# Patient Record
Sex: Female | Born: 1980 | Race: Black or African American | Hispanic: No | Marital: Single | Smoking: Never smoker
Health system: Southern US, Community
[De-identification: ages and names within clinical notes are randomized; demographics above are authoritative.]

## PROBLEM LIST (undated history)

## (undated) DIAGNOSIS — A6000 Herpesviral infection of urogenital system, unspecified: Secondary | ICD-10-CM

## (undated) HISTORY — DX: Herpesviral infection of urogenital system, unspecified: A60.00

## (undated) HISTORY — PX: NO PAST SURGERIES: SHX2092

---

## 2002-03-26 ENCOUNTER — Emergency Department (HOSPITAL_COMMUNITY): Admission: EM | Admit: 2002-03-26 | Discharge: 2002-03-26 | Payer: Self-pay | Admitting: Emergency Medicine

## 2020-07-27 DIAGNOSIS — N390 Urinary tract infection, site not specified: Secondary | ICD-10-CM | POA: Diagnosis not present

## 2020-07-27 DIAGNOSIS — R399 Unspecified symptoms and signs involving the genitourinary system: Secondary | ICD-10-CM | POA: Diagnosis not present

## 2020-07-29 NOTE — Patient Instructions (Signed)
I value your feedback and entrusting us with your care. If you get a [facility] patient survey, I would appreciate you taking the time to let us know about your experience today. Thank you!  As of October 04, 2019, your lab results will be released to your MyChart immediately, before I even have a chance to see them. Please give me time to review them and contact you if there are any abnormalities. Thank you for your patience.  

## 2020-07-29 NOTE — Progress Notes (Signed)
Patient, No Pcp Per   Chief Complaint  Patient presents with   Urinary Tract Infection    pressure, frequency, burning after urinating x 1 week    HPI:      [name] is a 39 y.o. No obstetric history on file. whose LMP was Patient's last menstrual period was 07/10/2020 (exact date)., presents today for NP eval of UTI sx for the past wk. Has urinary frequency and urgency, with and without good flow, as well as pelvic discomfort/pressure at end of stream. No hematuria, LBP, fevers. Not drinking caffeine, also not drinking much water. No hx of UTIs/kidney stones. Had blood and leuks on UA at urgent care with neg C&S 2 days ago. Was given macrobid before C&S results but pt hasn't taken yet since C&S neg. No vag odor/increased d/c. Maybe mild irritation. Using new dryer sheets, uses vagisil soap. She is not sex active.  New to area. Last annual 12/20, she thinks pap due this yr. No hx of abn paps.  Menses monthly, last 5 days, rare BTB, mild dysmen. On OCPS.   History reviewed. No pertinent past medical history.  History reviewed. No pertinent surgical history.  Family History  Problem Relation Age of Onset   Hypertension Mother    Heart disease Father    Heart attack Maternal Grandfather    Lung cancer Paternal Grandmother    Prostate cancer Paternal Grandmother     Social History   Socioeconomic History   Marital status: Single    Spouse name: Not on file   Number of children: Not on file   Years of education: Not on file   Highest education level: Not on file  Occupational History   Not on file  Tobacco Use   Smoking status: Never Smoker   Smokeless tobacco: Never Used  Vaping Use   Vaping Use: Never used  Substance and Sexual Activity   Alcohol use: Not Currently   Drug use: Never   Sexual activity: Not Currently    Birth control/protection: Pill  Other Topics Concern   Not on file  Social History Narrative   Not on file    Social Determinants of Health   Financial Resource Strain:    Difficulty of Paying Living Expenses: Not on file  Food Insecurity:    Worried About Running Out of Food in the Last Year: Not on file   [employer] of Food in the Last Year: Not on file  Transportation Needs:    Lack of Transportation (Medical): Not on file   Lack of Transportation (Non-Medical): Not on file  Physical Activity:    Days of Exercise per Week: Not on file   Minutes of Exercise per Session: Not on file  Stress:    Feeling of Stress : Not on file  Social Connections:    Frequency of Communication with Friends and Family: Not on file   Frequency of Social Gatherings with Friends and Family: Not on file   Attends Religious Services: Not on file   Active Member of Clubs or Organizations: Not on file   Attends Banker Meetings: Not on file   Marital Status: Not on file  Intimate Partner Violence:    Fear of Current or Ex-Partner: Not on file   Emotionally Abused: Not on file   Physically Abused: Not on file   Sexually Abused: Not on file    Outpatient Medications Prior to Visit  Medication Sig Dispense Refill   levonorgestrel-ethinyl  estradiol (ALTAVERA) 0.15-30 MG-MCG tablet      valACYclovir HCl (VALTREX PO) Take by mouth as needed.      fluconazole (DIFLUCAN) 150 MG tablet Take 150 mg by mouth once. (Patient not taking: Reported on 07/30/2020)     nitrofurantoin, macrocrystal-monohydrate, (MACROBID) 100 MG capsule Take by mouth. (Patient not taking: Reported on 07/30/2020)     No facility-administered medications prior to visit.      ROS:  Review of Systems  Constitutional: Negative for fever.  Gastrointestinal: Negative for blood in stool, constipation, diarrhea, nausea and vomiting.  Genitourinary: Positive for frequency, pelvic pain and urgency. Negative for dyspareunia, dysuria, flank pain, hematuria, vaginal bleeding, vaginal discharge and vaginal pain.   Musculoskeletal: Negative for back pain.  Skin: Negative for rash.   BREAST: No symptoms   OBJECTIVE:   Vitals:  BP 130/80    Ht 5\' 4"  (1.626 m)    Wt 172 lb (78 kg)    LMP 07/10/2020 (Exact Date)    BMI 29.52 kg/m   Physical Exam Vitals reviewed.  Constitutional:      Appearance: She is well-developed.  Pulmonary:     Effort: Pulmonary effort is normal.  Genitourinary:    General: Normal vulva.     Pubic Area: No rash.      Labia:        Right: No rash, tenderness or lesion.        Left: No rash, tenderness or lesion.      Vagina: Normal. No vaginal discharge, erythema or tenderness.     Cervix: Normal.     Uterus: Normal. Not enlarged and not tender.      Adnexa: Right adnexa normal and left adnexa normal.       Right: No mass or tenderness.         Left: No mass or tenderness.    Musculoskeletal:        General: Normal range of motion.     Cervical back: Normal range of motion.  Skin:    General: Skin is warm and dry.  Neurological:     General: No focal deficit present.     Mental Status: She is alert and oriented to person, place, and time.  Psychiatric:        Mood and Affect: Mood normal.        Behavior: Behavior normal.        Thought Content: Thought content normal.        Judgment: Judgment normal.     Results: Results for orders placed or performed in visit on 07/30/20 (from the past 24 hour(s))  POCT Urinalysis Dipstick     Status: Normal   Collection Time: 07/30/20 10:45 AM  Result Value Ref Range   Color, UA yellow    Clarity, UA clear    Glucose, UA Negative Negative   Bilirubin, UA neg    Ketones, UA neg    Spec Grav, UA 1.020 1.010 - 1.025   Blood, UA neg    pH, UA 5.0 5.0 - 8.0   Protein, UA Negative Negative   Urobilinogen, UA     Nitrite, UA neg    Leukocytes, UA Negative Negative   Appearance     Odor       Assessment/Plan: Urinary urgency - Plan: POCT Urinalysis Dipstick; Neg UA, neg C&S 2 days ago. Question bladder spasm.  Try uribel, increase water intake. If sx persist, can do macrobid Rx (already picked up Rx) to see if sx  improve. Rule out STDs.  Bladder spasm - Plan: Meth-Hyo-M Bl-Na Phos-Ph Sal (URIBEL) 118 MG CAPS  Cervical cancer screening - Plan: Cytology - PAP  Screening for STD (sexually transmitted disease) - Plan: Cytology - PAP  Screening for HPV (human papillomavirus) - Plan: Cytology - PAP  Vaginal irritation--neg exam. Line dry underwear, dove sens skin soap. F/u prn.   Meds ordered this encounter  Medications   Meth-Hyo-M Bl-Na Phos-Ph Sal (URIBEL) 118 MG CAPS    Sig: Take 1 capsule (118 mg total) by mouth 4 (four) times daily as needed.    Dispense:  30 capsule    Refill:  0    Order Specific Question:   Supervising Provider    Answer:   Nadara Mustard [[id number]]      Return if symptoms worsen or fail to improve.  Dhanvi Boesen B. Avyon Herendeen, PA-C 07/30/2020 10:48 AM

## 2020-07-30 ENCOUNTER — Ambulatory Visit (INDEPENDENT_AMBULATORY_CARE_PROVIDER_SITE_OTHER): Payer: BC Managed Care – PPO | Admitting: Obstetrics and Gynecology

## 2020-07-30 ENCOUNTER — Other Ambulatory Visit (HOSPITAL_COMMUNITY)
Admission: RE | Admit: 2020-07-30 | Discharge: 2020-07-30 | Disposition: A | Discharge status: Home / Self Care | Payer: BC Managed Care – PPO | Source: Ambulatory Visit | Attending: Obstetrics and Gynecology | Admitting: Obstetrics and Gynecology

## 2020-07-30 ENCOUNTER — Other Ambulatory Visit: Payer: Self-pay

## 2020-07-30 ENCOUNTER — Encounter: Payer: Self-pay | Admitting: Obstetrics and Gynecology

## 2020-07-30 VITALS — BP 130/80 | Ht 64.0 in | Wt 172.0 lb

## 2020-07-30 DIAGNOSIS — Z124 Encounter for screening for malignant neoplasm of cervix: Secondary | ICD-10-CM

## 2020-07-30 DIAGNOSIS — Z1151 Encounter for screening for human papillomavirus (HPV): Secondary | ICD-10-CM | POA: Diagnosis not present

## 2020-07-30 DIAGNOSIS — Z113 Encounter for screening for infections with a predominantly sexual mode of transmission: Secondary | ICD-10-CM | POA: Insufficient documentation

## 2020-07-30 DIAGNOSIS — N3289 Other specified disorders of bladder: Secondary | ICD-10-CM

## 2020-07-30 DIAGNOSIS — R3915 Urgency of urination: Secondary | ICD-10-CM

## 2020-07-30 LAB — POCT URINALYSIS DIPSTICK
Bilirubin, UA: NEGATIVE
Blood, UA: NEGATIVE
Glucose, UA: NEGATIVE
Ketones, UA: NEGATIVE
Leukocytes, UA: NEGATIVE
Nitrite, UA: NEGATIVE
Protein, UA: NEGATIVE
Spec Grav, UA: 1.02 (ref 1.010–1.025)
pH, UA: 5 (ref 5.0–8.0)

## 2020-07-30 MED ORDER — URIBEL 118 MG PO CAPS: 118.0000 mg | ORAL_CAPSULE | Freq: Four times a day (QID) | ORAL | 0 refills | Status: DC | PRN

## 2020-07-31 LAB — CYTOLOGY - PAP
Chlamydia: NEGATIVE
Comment: NEGATIVE
Comment: NEGATIVE
Comment: NORMAL
Diagnosis: NEGATIVE
High risk HPV: NEGATIVE
Neisseria Gonorrhea: NEGATIVE

## 2020-08-07 ENCOUNTER — Telehealth: Payer: Self-pay | Admitting: Obstetrics and Gynecology

## 2020-08-07 NOTE — Telephone Encounter (Signed)
Patient is calling to follow up about her appointment for 07/30/20. Patient reports she is still having symptoms and would like to speak with [facility] nurse. Please advise

## 2020-08-07 NOTE — Telephone Encounter (Signed)
Pt called back and wants to let [facility] know she is getting better. Says only thing right now is this uncomfortable feeling she has after urinating. Says she's abt to finish abx.

## 2020-08-07 NOTE — Telephone Encounter (Signed)
Called pt, taken straight to voicemail, LVMTRC.

## 2020-08-08 ENCOUNTER — Other Ambulatory Visit: Payer: Self-pay | Admitting: Obstetrics and Gynecology

## 2020-08-08 MED ORDER — LEVONORGESTREL-ETHINYL ESTRAD 0.15-30 MG-MCG PO TABS: 1.0000 | ORAL_TABLET | Freq: Every day | ORAL | 0 refills | Status: DC

## 2020-08-08 NOTE — Telephone Encounter (Signed)
Pt aware. Would like to know if you can send Rx for [facility] she's on ALTAVERA. Says she forgot to mention it at her visit.

## 2020-08-08 NOTE — Telephone Encounter (Signed)
Ok. Complete abx. F/u if sx persist. May need GYN u/s.

## 2020-08-08 NOTE — Telephone Encounter (Signed)
Called pharmacy spoke to [place], verbal [facility] Rx given.

## 2020-08-08 NOTE — Progress Notes (Signed)
OCP RF till annual due 12/21

## 2020-08-08 NOTE — Telephone Encounter (Signed)
Rx eRxd for 3 months. Annual due 12/21.

## 2020-08-08 NOTE — Telephone Encounter (Signed)
Pt aware.

## 2020-08-13 NOTE — Progress Notes (Signed)
Patient, No Pcp Per   Chief Complaint  Patient presents with  . Follow-up    u/s    HPI:      Janet Jones is a 39 y.o. G1P1001 whose LMP was Patient's last menstrual period was 08/06/2020 (exact date)., presents today for u/s f/u for persisting pelvic pressure. Pt seen 07/30/20 for UTI sx with pelvic pressure. Had neg C&S, treated empirically with uribel and macrobid without sx change. Not drinking caffeine. Pressure is intermittent, worse after urination and then it calms down. Sx also worse last wk with menses. Has frequency with good flow, no hematuria. Has tried heating pad without relief. No GI sx, has BM QOD, normal for her. Did have loose stools with abx last wk but sx prior to that and have persisted. No fevers. Has LBP to touch lower back.  Has vaginal irritation, no odor/d/c. Had neg exam, pap and STD testing 07/30/20. She hasn't been sex active for over 5 yrs. Changed to dove sens skin soap and d/c dryer sheets after last appt without sx change.   07/30/20 NOTE: Has urinary frequency and urgency, with and without good flow, as well as pelvic discomfort/pressure at end of stream. No hematuria, LBP, fevers. Not drinking caffeine, also not drinking much water. No hx of UTIs/kidney stones. Had blood and leuks on UA at urgent care with neg C&S 2 days ago. Was given macrobid before C&S results but pt hasn't taken yet since C&S neg. No vag odor/increased d/c. Maybe mild irritation. Using new dryer sheets, uses vagisil soap. She is not sex active.   Past Medical History:  Diagnosis Date  . Genital herpes     History reviewed. No pertinent surgical history.  Family History  Problem Relation Age of Onset  . Hypertension Mother   . Heart disease Father   . Heart attack Maternal Grandfather   . Lung cancer Paternal Grandmother   . Prostate cancer Paternal Grandmother     Social History   Socioeconomic History  . Marital status: Single    Spouse name: Not on file  .  Number of children: Not on file  . Years of education: Not on file  . Highest education level: Not on file  Occupational History  . Not on file  Tobacco Use  . Smoking status: Never Smoker  . Smokeless tobacco: Never Used  Vaping Use  . Vaping Use: Never used  Substance and Sexual Activity  . Alcohol use: Not Currently  . Drug use: Never  . Sexual activity: Not Currently    Birth control/protection: Pill  Other Topics Concern  . Not on file  Social History Narrative  . Not on file   Social Determinants of Health   Financial Resource Strain:   . Difficulty of Paying Living Expenses: Not on file  Food Insecurity:   . Worried About Programme researcher, broadcasting/film/video in the Last Year: Not on file  . Ran Out of Food in the Last Year: Not on file  Transportation Needs:   . Lack of Transportation (Medical): Not on file  . Lack of Transportation (Non-Medical): Not on file  Physical Activity:   . Days of Exercise per Week: Not on file  . Minutes of Exercise per Session: Not on file  Stress:   . Feeling of Stress : Not on file  Social Connections:   . Frequency of Communication with Friends and Family: Not on file  . Frequency of Social Gatherings with Friends and Family:  Not on file  . Attends Religious Services: Not on file  . Active Member of Clubs or Organizations: Not on file  . Attends Banker Meetings: Not on file  . Marital Status: Not on file  Intimate Partner Violence:   . Fear of Current or Ex-Partner: Not on file  . Emotionally Abused: Not on file  . Physically Abused: Not on file  . Sexually Abused: Not on file    Outpatient Medications Prior to Visit  Medication Sig Dispense Refill  . levonorgestrel-ethinyl estradiol (ALTAVERA) 0.15-30 MG-MCG tablet Take 1 tablet by mouth daily. 84 tablet 0  . valACYclovir HCl (VALTREX PO) Take by mouth as needed.     . fluconazole (DIFLUCAN) 150 MG tablet Take 150 mg by mouth once. (Patient not taking: Reported on 07/30/2020)      . Meth-Hyo-M Bl-Na Phos-Ph Sal (URIBEL) 118 MG CAPS Take 1 capsule (118 mg total) by mouth 4 (four) times daily as needed. 30 capsule 0   No facility-administered medications prior to visit.      ROS:  Review of Systems  Constitutional: Negative for fever.  Gastrointestinal: Negative for blood in stool, constipation, diarrhea, nausea and vomiting.  Genitourinary: Positive for dysuria, frequency and pelvic pain. Negative for dyspareunia, flank pain, hematuria, urgency, vaginal bleeding, vaginal discharge and vaginal pain.  Musculoskeletal: Negative for back pain.  Skin: Negative for rash.    OBJECTIVE:   Vitals:  BP 130/80   Ht 5\' 4"  (1.626 m)   Wt 171 lb (77.6 kg)   LMP 08/06/2020 (Exact Date)   BMI 29.35 kg/m   Physical Exam Vitals reviewed.  Constitutional:      Appearance: She is well-developed.  Pulmonary:     Effort: Pulmonary effort is normal.  Abdominal:     Palpations: Abdomen is soft.     Tenderness: There is abdominal tenderness in the suprapubic area. There is no right CVA tenderness, left CVA tenderness, guarding or rebound.  Genitourinary:    General: Normal vulva.     Pubic Area: No rash.      Labia:        Right: No rash, tenderness or lesion.        Left: No rash, tenderness or lesion.      Vagina: Normal. No vaginal discharge, erythema or tenderness.     Cervix: Normal.     Uterus: Normal. Tender. Not enlarged.      Adnexa: Right adnexa normal and left adnexa normal.       Right: No mass or tenderness.         Left: No mass or tenderness.    Musculoskeletal:        General: Normal range of motion.     Cervical back: Normal range of motion.  Skin:    General: Skin is warm and dry.  Neurological:     General: No focal deficit present.     Mental Status: She is alert and oriented to person, place, and time.  Psychiatric:        Mood and Affect: Mood normal.        Behavior: Behavior normal.        Thought Content: Thought content normal.         Judgment: Judgment normal.     Results: Results for orders placed or performed in visit on 08/14/20 (from the past 24 hour(s))  POCT Wet Prep with KOH     Status: Normal   Collection Time: 08/14/20  1:50 PM  Result Value Ref Range   Trichomonas, UA Negative    Clue Cells Wet Prep HPF POC neg    Epithelial Wet Prep HPF POC     Yeast Wet Prep HPF POC neg    Bacteria Wet Prep HPF POC     RBC Wet Prep HPF POC     WBC Wet Prep HPF POC     KOH Prep POC Negative Negative   ULTRASOUND REPORT  Location: [facility]  Date of Service: 08/14/2020    Indications:Pelvic Pain   Findings:  The uterus is anteverted and measures 6.3 x 3.6 x 3.3 cm. Echo texture is homogenous without evidence of focal masses. The Endometrium measures 4.2 mm.  Right Ovary measures 2.0 x 1.1 x 0.9 cm. It is normal in appearance. Left Ovary measures 2.1 x 1.6 x 1.1 cm. It is normal in appearance. Survey of the adnexa demonstrates no adnexal masses. There is no free fluid in the cul de sac.  Impression: 1. Normal pelvic ultrasound.   Recommendations: 1.Clinical correlation with the patient's History and Physical Exam.   Deanna Artis, RT  Assessment/Plan: Pelvic pressure in female - Plan: Ambulatory referral to Urology; Neg C&S, no improvement after abx use/uribel tx. Neg GYN u/s. Refer to urology for further eval.   Vaginal irritation - Plan: fluconazole (DIFLUCAN) 150 MG tablet, POCT Wet Prep with KOH; neg wet prep/neg exam. Treat empirically with diflucan. F/u prn.    Meds ordered this encounter  Medications  . fluconazole (DIFLUCAN) 150 MG tablet    Sig: Take 1 tablet (150 mg total) by mouth once for 1 dose.    Dispense:  1 tablet    Refill:  0    Order Specific Question:   Supervising Provider    Answer:   Nadara Mustard [[id number]]      Return if symptoms worsen or fail to improve.  Jayquan Bradsher B. Savanah Bayles, PA-C 08/14/2020 1:51 PM

## 2020-08-14 ENCOUNTER — Other Ambulatory Visit: Payer: Self-pay | Admitting: Obstetrics and Gynecology

## 2020-08-14 ENCOUNTER — Other Ambulatory Visit: Payer: Self-pay

## 2020-08-14 ENCOUNTER — Ambulatory Visit (INDEPENDENT_AMBULATORY_CARE_PROVIDER_SITE_OTHER): Payer: BC Managed Care – PPO

## 2020-08-14 ENCOUNTER — Encounter: Payer: Self-pay | Admitting: Obstetrics and Gynecology

## 2020-08-14 ENCOUNTER — Ambulatory Visit (INDEPENDENT_AMBULATORY_CARE_PROVIDER_SITE_OTHER): Payer: BC Managed Care – PPO | Admitting: Obstetrics and Gynecology

## 2020-08-14 VITALS — BP 130/80 | Ht 64.0 in | Wt 171.0 lb

## 2020-08-14 DIAGNOSIS — N898 Other specified noninflammatory disorders of vagina: Secondary | ICD-10-CM

## 2020-08-14 DIAGNOSIS — R102 Pelvic and perineal pain: Secondary | ICD-10-CM

## 2020-08-14 LAB — POCT WET PREP WITH KOH
Clue Cells Wet Prep HPF POC: NEGATIVE
KOH Prep POC: NEGATIVE
Trichomonas, UA: NEGATIVE
Yeast Wet Prep HPF POC: NEGATIVE

## 2020-08-14 MED ORDER — FLUCONAZOLE 150 MG PO TABS: 150.0000 mg | ORAL_TABLET | Freq: Once | ORAL | 0 refills | Status: AC

## 2020-08-14 NOTE — Patient Instructions (Signed)
I value your feedback and entrusting us with your care. If you get a [facility] patient survey, I would appreciate you taking the time to let us know about your experience today. Thank you!  As of October 04, 2019, your lab results will be released to your MyChart immediately, before I even have a chance to see them. Please give me time to review them and contact you if there are any abnormalities. Thank you for your patience.  

## 2020-08-25 ENCOUNTER — Encounter: Payer: Self-pay | Admitting: Urology

## 2020-08-25 ENCOUNTER — Other Ambulatory Visit: Payer: Self-pay

## 2020-08-25 ENCOUNTER — Ambulatory Visit: Payer: BC Managed Care – PPO | Admitting: Urology

## 2020-08-25 VITALS — BP 154/99 | HR 88 | Wt 170.0 lb

## 2020-08-25 DIAGNOSIS — R102 Pelvic and perineal pain: Secondary | ICD-10-CM

## 2020-08-25 DIAGNOSIS — N3020 Other chronic cystitis without hematuria: Secondary | ICD-10-CM | POA: Diagnosis not present

## 2020-08-25 MED ORDER — NITROFURANTOIN MACROCRYSTAL 100 MG PO CAPS: 100.0000 mg | ORAL_CAPSULE | Freq: Every day | ORAL | 11 refills | Status: DC

## 2020-08-25 NOTE — Patient Instructions (Signed)
Cystoscopy Cystoscopy is a procedure that is used to help diagnose and sometimes treat conditions that affect the lower urinary tract. The lower urinary tract includes the bladder and the urethra. The urethra is the tube that drains urine from the bladder. Cystoscopy is done using a thin, tube-shaped instrument with a light and camera at the end (cystoscope). The cystoscope may be hard or flexible, depending on the goal of the procedure. The cystoscope is inserted through the urethra, into the bladder. Cystoscopy may be recommended if you have:  Urinary tract infections that keep coming back.  Blood in the urine (hematuria).  An inability to control when you urinate (urinary incontinence) or an overactive bladder.  Unusual cells found in a urine sample.  A blockage in the urethra, such as a urinary stone.  Painful urination.  An abnormality in the bladder found during an intravenous pyelogram (IVP) or CT scan. Cystoscopy may also be done to remove a sample of tissue to be examined under a microscope (biopsy). What are the risks? Generally, this is a safe procedure. However, problems may occur, including:  Infection.  Bleeding.  What happens during the procedure?  1. You will be given one or more of the following: ? A medicine to numb the area (local anesthetic). 2. The area around the opening of your urethra will be cleaned. 3. The cystoscope will be passed through your urethra into your bladder. 4. Germ-free (sterile) fluid will flow through the cystoscope to fill your bladder. The fluid will stretch your bladder so that your health care provider can clearly examine your bladder walls. 5. Your doctor will look at the urethra and bladder. 6. The cystoscope will be removed The procedure may vary among health care providers  What can I expect after the procedure? After the procedure, it is common to have: 1. Some soreness or pain in your abdomen and urethra. 2. Urinary symptoms.  These include: ? Mild pain or burning when you urinate. Pain should stop within a few minutes after you urinate. This may last for up to 1 week. ? A small amount of blood in your urine for several days. ? Feeling like you need to urinate but producing only a small amount of urine. Follow these instructions at home: General instructions  Return to your normal activities as told by your health care provider.   Do not drive for 24 hours if you were given a sedative during your procedure.  Watch for any blood in your urine. If the amount of blood in your urine increases, call your health care provider.  If a tissue sample was removed for testing (biopsy) during your procedure, it is up to you to get your test results. Ask your health care provider, or the department that is doing the test, when your results will be ready.  Drink enough fluid to keep your urine pale yellow.  Keep all follow-up visits as told by your health care provider. This is important. Contact a health care provider if you:  Have pain that gets worse or does not get better with medicine, especially pain when you urinate.  Have trouble urinating.  Have more blood in your urine. Get help right away if you:  Have blood clots in your urine.  Have abdominal pain.  Have a fever or chills.  Are unable to urinate. Summary  Cystoscopy is a procedure that is used to help diagnose and sometimes treat conditions that affect the lower urinary tract.  Cystoscopy is done using   a thin, tube-shaped instrument with a light and camera at the end.  After the procedure, it is common to have some soreness or pain in your abdomen and urethra.  Watch for any blood in your urine. If the amount of blood in your urine increases, call your health care provider.  If you were prescribed an antibiotic medicine, take it as told by your health care provider. Do not stop taking the antibiotic even if you start to feel better. This  information is not intended to replace advice given to you by your health care provider. Make sure you discuss any questions you have with your health care provider. Document Revised: 10/03/2018 Document Reviewed: 10/03/2018 Elsevier Patient Education  2020 [employer]   

## 2020-08-25 NOTE — Progress Notes (Signed)
08/25/2020 9:11 AM   Bud Face [birth year] [id number]  Referring provider: Rica Records, PA-C [address] [place],  [place] [address]  No chief complaint on file.   HPI: Was consulted to assist the patient is 1 month history of suprapubic and vaginal discomfort or pressure especially after she urinates.  It comes and goes.  She was having increased frequency.  The frequency has improved but she still has a discomfort that was worse with her recent menstrual cycle.  The discomfort last for about 10 minutes after she voids  She has been taking Azo-Standard.  One course of an antibiotic did not help.  She describes 2 - urines and I am not certain if she truly had a culture.  She normally voids every 2-3 hours and gets up twice at night.  She is continent  No history of bladder surgery kidney stones or bladder infections.  No neurologic issues   PMH: Past Medical History:  Diagnosis Date  . Genital herpes     Surgical History: No past surgical history on file.  Home Medications:  Allergies as of 08/25/2020   No Known Allergies     Medication List       Accurate as of August 25, 2020  9:11 AM. If you have any questions, ask your nurse or doctor.        levonorgestrel-ethinyl estradiol 0.15-30 MG-MCG tablet Commonly known as: Altavera Take 1 tablet by mouth daily.   VALTREX PO Take by mouth as needed.       Allergies: No Known Allergies  Family History: Family History  Problem Relation Age of Onset  . Hypertension Mother   . Heart disease Father   . Heart attack Maternal Grandfather   . Lung cancer Paternal Grandmother   . Prostate cancer Paternal Grandmother     Social History:  reports that she has never smoked. She has never used smokeless tobacco. She reports previous alcohol use. She reports that she does not use drugs.  ROS:                                        Physical Exam: LMP 08/06/2020 (Exact Date)     Constitutional:  Alert and oriented, No acute distress. HEENT: [place] AT, moist mucus membranes.  Trachea midline, no masses. Cardiovascular: No clubbing, cyanosis, or edema. Respiratory: Normal respiratory effort, no increased work of breathing. GI: Abdomen is soft, nontender, nondistended, no abdominal masses GU: No prolapse.  No diverticulum.  No vaginitis.  No trigger points. Skin: No rashes, bruises or suspicious lesions. Lymph: No cervical or inguinal adenopathy. Neurologic: Grossly intact, no focal deficits, moving all 4 extremities. Psychiatric: Normal mood and affect.  Laboratory Data: No results found for: WBC, HGB, HCT, MCV, PLT  No results found for: CREATININE  No results found for: PSA  No results found for: TESTOSTERONE  No results found for: HGBA1C  Urinalysis    Component Value Date/Time   BILIRUBINUR neg 07/30/2020 1045   PROTEINUR Negative 07/30/2020 1045   NITRITE neg 07/30/2020 1045   LEUKOCYTESUR Negative 07/30/2020 1045    Pertinent Imaging: Chart reviewed.  Urine reviewed.  Urine sent for culture  Assessment & Plan: In the last month the patient does not have a pelvic pressure worse after urination.  Frequency has improved.  At baseline she has mild nocturia.  Urine sent for culture.  Is too  early to say whether she has interstitial cystitis and my index of suspicion is lower.  She probably did have a bladder infection and the concept of post urinary tract infection hypersensitivity was discussed.   I thought it was reasonable to place her on daily Macrodantin for the next 2 months and perhaps longer.  Bladder irritants discussed.  Call if urine culture is positive.  Local analgesics discussed.  Return for cystoscopy for safety reasons in 8 weeks.  Patient understands the concept of interstitial cystitis which I do not think she has  1. Pelvic pressure in female  - Urinalysis, Complete   No follow-ups on file.  Martina Sinner, MD  [facility]  [facility] [address] [place], [place] [address] [phone]

## 2020-08-26 LAB — URINALYSIS, COMPLETE
Bilirubin, UA: NEGATIVE
Glucose, UA: NEGATIVE
Nitrite, UA: NEGATIVE
Protein,UA: NEGATIVE
Specific Gravity, UA: 1.02 (ref 1.005–1.030)
Urobilinogen, Ur: 0.2 mg/dL (ref 0.2–1.0)
pH, UA: 5 (ref 5.0–7.5)

## 2020-08-26 LAB — MICROSCOPIC EXAMINATION

## 2020-08-29 LAB — CULTURE, URINE COMPREHENSIVE

## 2020-10-02 ENCOUNTER — Ambulatory Visit: Payer: BC Managed Care – PPO | Admitting: Obstetrics and Gynecology

## 2020-10-08 ENCOUNTER — Telehealth: Payer: Self-pay | Admitting: Urology

## 2020-10-08 MED ORDER — FLUCONAZOLE 100 MG PO TABS: 100.0000 mg | ORAL_TABLET | Freq: Every day | ORAL | 0 refills | Status: AC

## 2020-10-08 NOTE — Telephone Encounter (Signed)
Diflucan 100 mg daily for 3 days 

## 2020-10-08 NOTE — Telephone Encounter (Signed)
Spoke with patient, states she has been taking macrodantin for almost two months as prescribed and has been taking probiotics.  She is starting to feel as if she is getting a yeast infection and would like diflucan. Advised pt I will speak to Dr. Sherron Monday to see if that is something we can do. Please advise

## 2020-10-08 NOTE — Telephone Encounter (Signed)
Pt calling asking for Difluan to be sent to [facility] in [place] on [address].

## 2020-10-09 ENCOUNTER — Ambulatory Visit (INDEPENDENT_AMBULATORY_CARE_PROVIDER_SITE_OTHER): Payer: BC Managed Care – PPO | Admitting: Obstetrics and Gynecology

## 2020-10-09 ENCOUNTER — Encounter: Payer: Self-pay | Admitting: Obstetrics and Gynecology

## 2020-10-09 ENCOUNTER — Other Ambulatory Visit: Payer: Self-pay

## 2020-10-09 VITALS — BP 122/78 | Ht 64.0 in | Wt 168.0 lb

## 2020-10-09 DIAGNOSIS — Z3041 Encounter for surveillance of contraceptive pills: Secondary | ICD-10-CM | POA: Diagnosis not present

## 2020-10-09 DIAGNOSIS — Z1339 Encounter for screening examination for other mental health and behavioral disorders: Secondary | ICD-10-CM | POA: Diagnosis not present

## 2020-10-09 DIAGNOSIS — Z1331 Encounter for screening for depression: Secondary | ICD-10-CM | POA: Diagnosis not present

## 2020-10-09 DIAGNOSIS — Z01419 Encounter for gynecological examination (general) (routine) without abnormal findings: Secondary | ICD-10-CM

## 2020-10-09 MED ORDER — LEVONORGESTREL-ETHINYL ESTRAD 0.15-30 MG-MCG PO TABS: 1.0000 | ORAL_TABLET | Freq: Every day | ORAL | 4 refills | Status: DC

## 2020-10-09 NOTE — Progress Notes (Signed)
Gynecology Annual Exam  PCP: Patient, No Pcp Per  Chief Complaint  Patient presents with  . Annual Exam    History of Present Illness:  [name] is a 39 y.o. G1P1001 who LMP was Patient's last menstrual period was 10/02/2020., presents today for her annual examination.  Her menses are regular every 28-30 days, lasting 4 day(s).  Dysmenorrhea mild, occurring first 1-2 days of flow. She does not have intermenstrual bleeding.  She is not sexually active.  Last Pap: 07/2020  Results were: no abnormalities /neg HPV DNA negative Hx of STDs: HSV  Last mammogram: she had one in [place] a year ago.  She reports she had follow up images.  She states that she was asked to have follow up imaging.  She is at that point now. She states that she needs to go by and sign a release of information for the imaging.  There is no FH of breast cancer. There is no FH of ovarian cancer. The patient does do self-breast exams.  Colonoscopy: no  DEXA: has not been screened for osteoporosis  Tobacco use: The patient denies current or previous tobacco use. Alcohol use: social drinker Exercise: yes.  She has started going to the gym at least 3 days per week.  The patient wears seatbelts: yes.     She saw a urologist on 11/1 and she was placed on a low-dose antibiotic. She has a cystoscopy scheduled for January.  She has been taking probiotics. She had some irritation. She took a single-dose of Monistat yesterday. She might be feeling a little better for this.   Past Medical History:  Diagnosis Date  . Genital herpes     Past Surgical History:  Procedure Laterality Date  . NO PAST SURGERIES      Prior to Admission medications   Medication Sig Start Date End Date Taking? Authorizing Provider  levonorgestrel-ethinyl estradiol (ALTAVERA) 0.15-30 MG-MCG tablet Take 1 tablet by mouth daily. 08/08/20  Yes Copland, Ilona Sorrel, PA-C  nitrofurantoin (MACRODANTIN) 100 MG capsule Take 1 capsule (100 mg  total) by mouth daily. 08/25/20  Yes MacDiarmid, Lorin Picket, MD  valACYclovir HCl (VALTREX PO) Take by mouth as needed.    Yes [provider]    No Known Allergies  Obstetric History: G1P1001  Family History  Problem Relation Age of Onset  . Hypertension Mother   . Heart disease Father   . Heart attack Maternal Grandfather   . Lung cancer Paternal Grandmother   . Prostate cancer Paternal Grandmother     Social History   Socioeconomic History  . Marital status: Single    Spouse name: Not on file  . Number of children: Not on file  . Years of education: Not on file  . Highest education level: Not on file  Occupational History  . Not on file  Tobacco Use  . Smoking status: Never Smoker  . Smokeless tobacco: Never Used  Vaping Use  . Vaping Use: Never used  Substance and Sexual Activity  . Alcohol use: Not Currently  . Drug use: Never  . Sexual activity: Not Currently    Birth control/protection: Pill  Other Topics Concern  . Not on file  Social History Narrative  . Not on file   Social Determinants of Health   Financial Resource Strain: Not on file  Food Insecurity: Not on file  Transportation Needs: Not on file  Physical Activity: Not on file  Stress: Not on file  Social Connections: Not on  file  Intimate Partner Violence: Not on file    Review of Systems  Constitutional: Negative.   HENT: Negative.   Eyes: Negative.   Respiratory: Negative.   Cardiovascular: Negative.   Gastrointestinal: Negative.   Genitourinary: Negative.   Musculoskeletal: Negative.   Skin: Negative.   Neurological: Negative.   Psychiatric/Behavioral: Negative.      Physical Exam BP 122/78   Ht 5\' 4"  (1.626 m)   Wt 168 lb (76.2 kg)   LMP 10/02/2020   BMI 28.84 kg/m   Physical Exam Constitutional:      General: She is not in acute distress.    Appearance: Normal appearance. She is well-developed.  Genitourinary:     Genitourinary Comments: Deferred, recently done   Breasts:     Right: No swelling, bleeding, inverted nipple, mass, nipple discharge, skin change, tenderness, axillary adenopathy or supraclavicular adenopathy.     Left: No swelling, bleeding, inverted nipple, mass, nipple discharge, skin change, tenderness, axillary adenopathy or supraclavicular adenopathy.    HENT:     Head: Normocephalic and atraumatic.  Eyes:     General: No scleral icterus.    Conjunctiva/sclera: Conjunctivae normal.  Cardiovascular:     Rate and Rhythm: Normal rate and regular rhythm.     Heart sounds: No murmur heard. No friction rub. No gallop.   Pulmonary:     Effort: Pulmonary effort is normal. No respiratory distress.     Breath sounds: Normal breath sounds. No wheezing or rales.  Abdominal:     General: Bowel sounds are normal. There is no distension.     Palpations: Abdomen is soft. There is no mass.     Tenderness: There is no abdominal tenderness. There is no guarding or rebound.  Musculoskeletal:        General: Normal range of motion.     Cervical back: Normal range of motion and neck supple.  Lymphadenopathy:     Upper Body:     Right upper body: No supraclavicular or axillary adenopathy.     Left upper body: No supraclavicular or axillary adenopathy.  Neurological:     General: No focal deficit present.     Mental Status: She is alert and oriented to person, place, and time.     Cranial Nerves: No cranial nerve deficit.  Skin:    General: Skin is warm and dry.     Findings: No erythema.  Psychiatric:        Mood and Affect: Mood normal.        Behavior: Behavior normal.        Judgment: Judgment normal.     Female chaperone present for pelvic and breast  portions of the physical exam  Results: AUDIT Questionnaire (screen for alcoholism): 1 PHQ-9: 2  Assessment: 39 y.o. G67P1001 female here for routine gynecologic examination.  Plan: Problem List Items Addressed This Visit   None   Visit Diagnoses    Women's annual routine  gynecological examination    -  Primary   Relevant Medications   levonorgestrel-ethinyl estradiol (ALTAVERA) 0.15-30 MG-MCG tablet   Screening for depression       Screening for alcoholism       Encounter for surveillance of contraceptive pills       Relevant Medications   levonorgestrel-ethinyl estradiol (ALTAVERA) 0.15-30 MG-MCG tablet      Screening: -- Blood pressure screen normal -- Colonoscopy - not due -- Mammogram - due. Patient to call [facility] to arrange. She understands that it is her  responsibility to arrange this. -- Weight screening: normal -- Depression screening negative (PHQ-9) -- Nutrition: normal -- cholesterol screening: not due for screening -- osteoporosis screening: not due -- tobacco screening: not using -- alcohol screening: AUDIT questionnaire indicates low-risk usage. -- family history of breast cancer screening: done. not at high risk. -- no evidence of domestic violence or intimate partner violence. -- STD screening: gonorrhea/chlamydia NAAT not collected per patient request. -- pap smear not collected per ASCCP guidelines  Thomasene Mohair, MD 10/09/2020 12:47 PM

## 2020-10-15 ENCOUNTER — Ambulatory Visit: Payer: BC Managed Care – PPO | Admitting: Obstetrics and Gynecology

## 2020-10-23 ENCOUNTER — Ambulatory Visit: Payer: BC Managed Care – PPO | Admitting: Advanced Practice Midwife

## 2020-10-27 ENCOUNTER — Encounter: Payer: Self-pay | Admitting: Urology

## 2020-10-27 ENCOUNTER — Other Ambulatory Visit: Payer: Self-pay

## 2020-10-27 ENCOUNTER — Ambulatory Visit: Payer: BC Managed Care – PPO | Admitting: Urology

## 2020-10-27 VITALS — BP 181/107 | HR 90

## 2020-10-27 DIAGNOSIS — N3020 Other chronic cystitis without hematuria: Secondary | ICD-10-CM

## 2020-10-27 NOTE — Progress Notes (Signed)
10/27/2020 9:22 AM   Bud Face 06-Aug-1981 [id number]  Referring provider: No referring provider defined for this encounter.  Chief Complaint  Patient presents with  . Cysto    HPI: I was consulted to assist the patient with 1 month history of suprapubic and vaginal discomfort or pressure especially after she urinates.  It comes and goes.  She was having increased frequency.  The frequency has improved but she still has a discomfort that was worse with her recent menstrual cycle.  The discomfort last for about 10 minutes after she voids  She has been taking Azo-Standard.  One course of an antibiotic did not help.  She describes 2 - urines and I am not certain if she truly had a culture.  She normally voids every 2-3 hours and gets up twice at night.  She is continent  GU: No prolapse.  No diverticulum.  No vaginitis.  No trigger points.  In the last month the patient have a pelvic pressure worse after urination.  Frequency has improved.  At baseline she has mild nocturia.  Urine sent for culture.  Is too early to say whether she has interstitial cystitis and my index of suspicion is lower.  She probably did have a bladder infection and the concept of post urinary tract infection hypersensitivity was discussed.   I thought it was reasonable to place her on daily Macrodantin for the next 2 months and perhaps longer.  Bladder irritants discussed.  Call if urine culture is positive.  Local analgesics discussed.  Return for cystoscopy for safety reasons in 8 weeks.  Patient understands the concept of interstitial cystitis which I do not think she has  Today Frequency stable.  Last urine culture negative Urgency and frequency is settled.  No more pain.  No more pressure. On pelvic examination mild meatal stenosis.  No prolapse.  Cystoscopy: Patient underwent flexible cystoscopy.  I had to use the right upper catheter 14 [place] initially for mild meatal stenosis.  She tolerated it  well.  Bladder mucosa and trigone were normal.  No stitch or foreign body or carcinoma.  No pain with bladder filling.  Sterile technique utilized.   PMH: Past Medical History:  Diagnosis Date  . Genital herpes     Surgical History: Past Surgical History:  Procedure Laterality Date  . NO PAST SURGERIES      Home Medications:  Allergies as of 10/27/2020   No Known Allergies     Medication List       Accurate as of October 27, 2020  9:22 AM. If you have any questions, ask your nurse or doctor.        levonorgestrel-ethinyl estradiol 0.15-30 MG-MCG tablet Commonly known as: Altavera Take 1 tablet by mouth daily.   nitrofurantoin 100 MG capsule Commonly known as: Macrodantin Take 1 capsule (100 mg total) by mouth daily.   VALTREX PO Take by mouth as needed.       Allergies: No Known Allergies  Family History: Family History  Problem Relation Age of Onset  . Hypertension Mother   . Heart disease Father   . Heart attack Maternal Grandfather   . Lung cancer Paternal Grandmother   . Prostate cancer Paternal Grandmother     Social History:  reports that she has never smoked. She has never used smokeless tobacco. She reports previous alcohol use. She reports that she does not use drugs.  ROS:  Physical Exam: LMP 10/02/2020   Constitutional:  Alert and oriented, No acute distress.   Laboratory Data: No results found for: WBC, HGB, HCT, MCV, PLT  No results found for: CREATININE  No results found for: PSA  No results found for: TESTOSTERONE  No results found for: HGBA1C  Urinalysis    Component Value Date/Time   APPEARANCEUR Hazy (A) 08/25/2020 0913   GLUCOSEU Negative 08/25/2020 0913   BILIRUBINUR Negative 08/25/2020 0913   PROTEINUR Negative 08/25/2020 0913   NITRITE Negative 08/25/2020 0913   LEUKOCYTESUR Trace (A) 08/25/2020 0913    Pertinent Imaging:   Assessment & Plan: Patient  in my opinion has normalized.  She has mild sensations in the pelvic area but otherwise is doing well.  She can stop the daily Macrodantin.  I will see her as needed.  1. Chronic cystitis  - Urinalysis, Complete   No follow-ups on file.  Martina Sinner, MD  [facility] [address], [address] [place], [place] [address] [phone]

## 2020-10-28 LAB — URINALYSIS, COMPLETE
Bilirubin, UA: NEGATIVE
Glucose, UA: NEGATIVE
Ketones, UA: NEGATIVE
Nitrite, UA: NEGATIVE
Protein,UA: NEGATIVE
RBC, UA: NEGATIVE
Specific Gravity, UA: 1.02 (ref 1.005–1.030)
Urobilinogen, Ur: 0.2 mg/dL (ref 0.2–1.0)
pH, UA: 6 (ref 5.0–7.5)

## 2020-10-28 LAB — MICROSCOPIC EXAMINATION: RBC, Urine: NONE SEEN /hpf (ref 0–2)

## 2020-10-29 ENCOUNTER — Other Ambulatory Visit: Payer: Self-pay

## 2020-10-29 ENCOUNTER — Other Ambulatory Visit (HOSPITAL_COMMUNITY)
Admission: RE | Admit: 2020-10-29 | Discharge: 2020-10-29 | Disposition: A | Discharge status: Home / Self Care | Payer: BC Managed Care – PPO | Source: Ambulatory Visit | Attending: Advanced Practice Midwife | Admitting: Advanced Practice Midwife

## 2020-10-29 ENCOUNTER — Ambulatory Visit (INDEPENDENT_AMBULATORY_CARE_PROVIDER_SITE_OTHER): Payer: BC Managed Care – PPO | Admitting: Advanced Practice Midwife

## 2020-10-29 ENCOUNTER — Encounter: Payer: Self-pay | Admitting: Advanced Practice Midwife

## 2020-10-29 VITALS — BP 112/70 | Ht 64.0 in | Wt 171.0 lb

## 2020-10-29 DIAGNOSIS — N898 Other specified noninflammatory disorders of vagina: Secondary | ICD-10-CM | POA: Insufficient documentation

## 2020-10-29 NOTE — Progress Notes (Signed)
Patient ID: Janet Jones, female   DOB: [birth year], 40 y.o.   MRN: [id number]  Reason for Consult: Gynecologic Exam (Pt wants to be rechecked  for a discharge issue she has been dealing with for a while.)    Subjective:  HPI:  Janet Jones is a 40 y.o. female being seen for lingering vaginitis symptoms. She has a history of chronic cystitis and was seen by urology for cystoscopy. She finished antibiotics last week. She has tried to avoid yeast symptoms by taking probiotics and eating yogurt, and wearing loose fitting clothes. Mid December she used OTC Monistat 1 day treatment which gave her some relief from symptoms of discharge, irritation and itching. Currently she has thin white discharge and external irritation and itching. She denies odor. She denies concern for STDs and is not currently sexually active. She also mentions night sweats twice a week for the past 6 to 7 months. We discussed possible perimenopausal changes.    Past Medical History:  Diagnosis Date  . Genital herpes    Family History  Problem Relation Age of Onset  . Hypertension Mother   . Heart disease Father   . Heart attack Maternal Grandfather   . Lung cancer Paternal Grandmother   . Prostate cancer Paternal Grandmother    Past Surgical History:  Procedure Laterality Date  . NO PAST SURGERIES      Short Social History:  Social History   Tobacco Use  . Smoking status: Never Smoker  . Smokeless tobacco: Never Used  Substance Use Topics  . Alcohol use: Not Currently    No Known Allergies  Current Outpatient Medications  Medication Sig Dispense Refill  . levonorgestrel-ethinyl estradiol (ALTAVERA) 0.15-30 MG-MCG tablet Take 1 tablet by mouth daily. 84 tablet 4  . nitrofurantoin (MACRODANTIN) 100 MG capsule Take 1 capsule (100 mg total) by mouth daily. 30 capsule 11  . valACYclovir HCl (VALTREX PO) Take by mouth as needed.      No current facility-administered medications for this visit.     Review of Systems  Constitutional: Negative for chills and fever.  HENT: Negative for congestion, ear discharge, ear pain, hearing loss, sinus pain and sore throat.   Eyes: Negative for blurred vision and double vision.  Respiratory: Negative for cough, shortness of breath and wheezing.   Cardiovascular: Negative for chest pain, palpitations and leg swelling.  Gastrointestinal: Negative for abdominal pain, blood in stool, constipation, diarrhea, heartburn, melena, nausea and vomiting.  Genitourinary: Negative for dysuria, flank pain, frequency, hematuria and urgency.       Positive for vaginal discharge, irritation and itching  Musculoskeletal: Negative for back pain, joint pain and myalgias.  Skin: Negative for itching and rash.  Neurological: Negative for dizziness, tingling, tremors, sensory change, speech change, focal weakness, seizures, loss of consciousness, weakness and headaches.  Endo/Heme/Allergies: Negative for environmental allergies. Does not bruise/bleed easily.       Positive for night sweats  Psychiatric/Behavioral: Negative for depression, hallucinations, memory loss, substance abuse and suicidal ideas. The patient is not nervous/anxious and does not have insomnia.         Objective:  Objective   Vitals:   10/29/20 1050  BP: 112/70  Weight: 171 lb (77.6 kg)  Height: 5\' 4"  (1.626 m)   Body mass index is 29.35 kg/m. Constitutional: Well nourished, well developed female in no acute distress.  HEENT: normal Skin: Warm and dry.  Respiratory: Clear to auscultation bilateral. Normal respiratory effort Neuro: DTRs 2+, Cranial nerves grossly  intact Psych: Alert and Oriented x3. No memory deficits. Normal mood and affect.  MS: normal gait, normal bilateral lower extremity ROM/strength/stability.  Pelvic exam:  is not limited by body habitus EGBUS: within normal limits Vagina: within normal limits and with normal mucosa, mucous discharge, vaginitis aptima  collected Cervix: not evaluated   Assessment/Plan:  40 y.o. G1 P108 female with symptoms of vaginitis- possible yeast infection  Aptima- vaginitis collected Follow up as needed after lab results   Tresea Mall CNM [facility] 10/29/2020, 2:41 PM

## 2020-10-30 LAB — CERVICOVAGINAL ANCILLARY ONLY
Bacterial Vaginitis (gardnerella): NEGATIVE
Candida Glabrata: NEGATIVE
Candida Vaginitis: NEGATIVE
Comment: NEGATIVE
Comment: NEGATIVE
Comment: NEGATIVE

## 2020-10-31 ENCOUNTER — Telehealth: Payer: Self-pay | Admitting: Advanced Practice Midwife

## 2020-10-31 ENCOUNTER — Other Ambulatory Visit: Payer: Self-pay | Admitting: Advanced Practice Midwife

## 2020-10-31 DIAGNOSIS — N951 Menopausal and female climacteric states: Secondary | ICD-10-CM

## 2020-10-31 NOTE — Telephone Encounter (Signed)
Patient is calling for labs results. Please advise. 

## 2020-10-31 NOTE — Progress Notes (Signed)
Order placed for estradiol level per patient request- concern for thinning of vaginal tissue causing itching symptoms

## 2020-10-31 NOTE — Telephone Encounter (Signed)
Spoke with patient regarding lab results. She will be calling for a lab-only visit to check estrogen level.

## 2020-11-03 ENCOUNTER — Other Ambulatory Visit: Payer: Self-pay

## 2020-11-03 ENCOUNTER — Other Ambulatory Visit: Payer: BC Managed Care – PPO

## 2020-11-03 DIAGNOSIS — N951 Menopausal and female climacteric states: Secondary | ICD-10-CM

## 2020-11-04 LAB — ESTRADIOL: Estradiol: 20.9 pg/mL

## 2020-11-07 ENCOUNTER — Telehealth: Payer: Self-pay

## 2020-11-07 NOTE — Telephone Encounter (Signed)
Patient is calling for labs results. Please advise. 

## 2020-11-07 NOTE — Telephone Encounter (Signed)
Left voicemail for patient regarding normal lab results- had previously sent MyChart message regarding lab results.

## 2020-11-12 NOTE — Telephone Encounter (Signed)
Patient is scheduled for 11/13/20 with JEG

## 2020-11-13 ENCOUNTER — Ambulatory Visit (INDEPENDENT_AMBULATORY_CARE_PROVIDER_SITE_OTHER): Payer: BC Managed Care – PPO | Admitting: Advanced Practice Midwife

## 2020-11-13 ENCOUNTER — Encounter: Payer: Self-pay | Admitting: Advanced Practice Midwife

## 2020-11-13 ENCOUNTER — Other Ambulatory Visit: Payer: Self-pay

## 2020-11-13 ENCOUNTER — Other Ambulatory Visit (HOSPITAL_COMMUNITY)
Admission: RE | Admit: 2020-11-13 | Discharge: 2020-11-13 | Disposition: A | Discharge status: Home / Self Care | Payer: BC Managed Care – PPO | Source: Ambulatory Visit | Attending: Advanced Practice Midwife | Admitting: Advanced Practice Midwife

## 2020-11-13 VITALS — BP 122/80 | Ht 64.0 in | Wt 167.0 lb

## 2020-11-13 DIAGNOSIS — Z113 Encounter for screening for infections with a predominantly sexual mode of transmission: Secondary | ICD-10-CM | POA: Diagnosis not present

## 2020-11-13 NOTE — Progress Notes (Signed)
Patient ID: Janet Jones, female   DOB: 05/08/1981, 40 y.o.   MRN: [id number]  Reason for Consult: std testing    Subjective:  HPI:  Janet Jones is a 40 y.o. female being seen for continued symptoms of pain/pressue around her urethra and mucous discharge. She was seen 2 weeks ago for vaginitis testing and declined STD testing at that time. She request STD testing today for "peace of mind". She wonders if the ongoing discomfort around the urethra is related to having dealt with UTI for so long. She did have a flexible cystoscopy on January 3rd of this month.    Past Medical History:  Diagnosis Date  . Genital herpes    Family History  Problem Relation Age of Onset  . Hypertension Mother   . Heart disease Father   . Heart attack Maternal Grandfather   . Lung cancer Paternal Grandmother   . Prostate cancer Paternal Grandmother    Past Surgical History:  Procedure Laterality Date  . NO PAST SURGERIES      Short Social History:  Social History   Tobacco Use  . Smoking status: Never Smoker  . Smokeless tobacco: Never Used  Substance Use Topics  . Alcohol use: Not Currently    No Known Allergies  Current Outpatient Medications  Medication Sig Dispense Refill  . levonorgestrel-ethinyl estradiol (ALTAVERA) 0.15-30 MG-MCG tablet Take 1 tablet by mouth daily. 84 tablet 4  . valACYclovir HCl (VALTREX PO) Take by mouth as needed.     . nitrofurantoin (MACRODANTIN) 100 MG capsule Take 1 capsule (100 mg total) by mouth daily. (Patient not taking: Reported on 11/13/2020) 30 capsule 11   No current facility-administered medications for this visit.    Review of Systems  Constitutional: Negative for chills and fever.  HENT: Negative for congestion, ear discharge, ear pain, hearing loss, sinus pain and sore throat.   Eyes: Negative for blurred vision and double vision.  Respiratory: Negative for cough, shortness of breath and wheezing.   Cardiovascular: Negative for chest  pain, palpitations and leg swelling.  Gastrointestinal: Negative for abdominal pain, blood in stool, constipation, diarrhea, heartburn, melena, nausea and vomiting.  Genitourinary: Negative for dysuria, flank pain, frequency, hematuria and urgency.       Positive for discomfort associated with voiding, and mucous discharge  Musculoskeletal: Negative for back pain, joint pain and myalgias.  Skin: Negative for itching and rash.  Neurological: Negative for dizziness, tingling, tremors, sensory change, speech change, focal weakness, seizures, loss of consciousness, weakness and headaches.  Endo/Heme/Allergies: Negative for environmental allergies. Does not bruise/bleed easily.  Psychiatric/Behavioral: Negative for depression, hallucinations, memory loss, substance abuse and suicidal ideas. The patient is not nervous/anxious and does not have insomnia.         Objective:  Objective   Vitals:   11/13/20 1125  BP: 122/80  Weight: 167 lb (75.8 kg)  Height: 5\' 4"  (1.626 m)   Body mass index is 28.67 kg/m. Constitutional: Well nourished, well developed female in no acute distress.  HEENT: normal Skin: Warm and dry.  Respiratory:  Normal respiratory effort Neuro: DTRs 2+, Cranial nerves grossly intact Psych: Alert and Oriented x3. No memory deficits. Normal mood and affect.  MS: normal gait, normal bilateral lower extremity ROM/strength/stability.  Pelvic exam:  is not limited by body habitus EGBUS: within normal limits Vagina: within normal limits and with normal mucosa, scant mucous discharge, redness around urethra- likely from recent cystoscopy, STD aptima swab collected Cervix: not evaluated  Assessment/Plan:     40 y.o. G1 P27 female with urethral discomfort likely associated with recent cystoscopy, STD testing   STD aptima HIV, RPR, Hepatitis panel   Tresea Mall CNM [facility] 11/13/2020, 11:52 AM

## 2020-11-14 LAB — CERVICOVAGINAL ANCILLARY ONLY
Chlamydia: NEGATIVE
Comment: NEGATIVE
Comment: NEGATIVE
Comment: NORMAL
Neisseria Gonorrhea: NEGATIVE
Trichomonas: NEGATIVE

## 2020-11-14 LAB — HEPATITIS PANEL, ACUTE
Hep A IgM: NEGATIVE
Hep B C IgM: NEGATIVE
Hep C Virus Ab: 0.1 {s_co_ratio} (ref 0.0–0.9)
Hepatitis B Surface Ag: NEGATIVE

## 2020-11-14 LAB — RPR QUALITATIVE: RPR Ser Ql: NONREACTIVE

## 2020-11-14 LAB — HIV ANTIBODY (ROUTINE TESTING W REFLEX): HIV Screen 4th Generation wRfx: NONREACTIVE

## 2021-02-17 DIAGNOSIS — R1013 Epigastric pain: Secondary | ICD-10-CM | POA: Diagnosis not present

## 2021-04-29 ENCOUNTER — Telehealth: Payer: BC Managed Care – PPO

## 2021-04-29 NOTE — Telephone Encounter (Signed)
Pt calling; needs refill of valtrex; it was originally rx'd by provider where she used to live; is about out and wondering if we could refill it; was seen in December.  [phone]

## 2021-05-01 ENCOUNTER — Other Ambulatory Visit: Payer: Self-pay | Admitting: Advanced Practice Midwife

## 2021-05-01 DIAGNOSIS — B009 Herpesviral infection, unspecified: Secondary | ICD-10-CM

## 2021-05-01 MED ORDER — VALACYCLOVIR HCL 500 MG PO TABS: 500.0000 mg | ORAL_TABLET | Freq: Every day | ORAL | 3 refills | Status: DC

## 2021-05-01 NOTE — Progress Notes (Signed)
Rx valtrex per patient request. Message to patient.

## 2021-05-01 NOTE — Telephone Encounter (Signed)
Rx valtrex and message sent for patient.

## 2021-05-12 ENCOUNTER — Telehealth: Payer: BC Managed Care – PPO | Admitting: Emergency Medicine

## 2021-05-12 DIAGNOSIS — U071 COVID-19: Secondary | ICD-10-CM | POA: Diagnosis not present

## 2021-05-12 MED ORDER — MOLNUPIRAVIR EUA 200MG CAPSULE: 4.0000 | ORAL_CAPSULE | Freq: Two times a day (BID) | ORAL | 0 refills | Status: AC

## 2021-05-12 MED ORDER — BENZONATATE 100 MG PO CAPS: 100.0000 mg | ORAL_CAPSULE | Freq: Two times a day (BID) | ORAL | 0 refills | Status: DC | PRN

## 2021-05-12 NOTE — Progress Notes (Signed)
Virtual Visit Consent   JONQUIL STUBBE, you are scheduled for a virtual visit with a [facility] provider today.     Just as with appointments in the office, your consent must be obtained to participate.  Your consent will be active for this visit and any virtual visit you may have with one of our providers in the next 365 days.     If you have a MyChart account, a copy of this consent can be sent to you electronically.  All virtual visits are billed to your insurance company just like a traditional visit in the office.    As this is a virtual visit, video technology does not allow for your provider to perform a traditional examination.  This may limit your provider's ability to fully assess your condition.  If your provider identifies any concerns that need to be evaluated in person or the need to arrange testing (such as labs, EKG, etc.), we will make arrangements to do so.     Although advances in technology are sophisticated, we cannot ensure that it will always work on either your end or our end.  If the connection with a video visit is poor, the visit may have to be switched to a telephone visit.  With either a video or telephone visit, we are not always able to ensure that we have a secure connection.     I need to obtain your verbal consent now.   Are you willing to proceed with your visit today?    DILYNN MUNROE has provided verbal consent on 05/12/2021 for a virtual visit (video or telephone).   Roxy Horseman, PA-C   Date: 05/12/2021 2:35 PM   Virtual Visit via Video Note   I, Roxy Horseman, connected with  AREEBA SULSER  ([id number], 08/26/1981) on 05/12/21 at  2:45 PM EDT by a video-enabled telemedicine application and verified that I am speaking with the correct person using two identifiers.  Location: Patient: Virtual Visit Location Patient: Home Provider: Virtual Visit Location Provider: Home Office   I discussed the limitations of evaluation and management by  telemedicine and the availability of in person appointments. The patient expressed understanding and agreed to proceed.    History of Present Illness: Janet Jones is a 40 y.o. who identifies as a female who was assigned female at birth, and is being seen today for COVID.  Had a positive home test today.  Had some sore throat last night.  Denies measured fever.  Has tried taking Dayquil.  Also reports a bit of head congestion and chest congestion.  HPI: HPI  Problems: There are no problems to display for this patient.   Allergies: No Known Allergies Medications:  Current Outpatient Medications:    levonorgestrel-ethinyl estradiol (ALTAVERA) 0.15-30 MG-MCG tablet, Take 1 tablet by mouth daily., Disp: 84 tablet, Rfl: 4   nitrofurantoin (MACRODANTIN) 100 MG capsule, Take 1 capsule (100 mg total) by mouth daily. (Patient not taking: Reported on 11/13/2020), Disp: 30 capsule, Rfl: 11   valACYclovir (VALTREX) 500 MG tablet, Take 1 tablet (500 mg total) by mouth daily., Disp: 90 tablet, Rfl: 3  Observations/Objective: Patient is well-developed, well-nourished in no acute distress.  Resting comfortably at home.  Head is normocephalic, atraumatic.  No labored breathing.  Speech is clear and coherent with logical content.  Patient is alert and oriented at baseline.    Assessment and Plan: 1. COVID-19 - Molnupiravir - Tessalon   Follow Up Instructions: I discussed the assessment  and treatment plan with the patient. The patient was provided an opportunity to ask questions and all were answered. The patient agreed with the plan and demonstrated an understanding of the instructions.  A copy of instructions were sent to the patient via MyChart.  The patient was advised to call back or seek an in-person evaluation if the symptoms worsen or if the condition fails to improve as anticipated.  Time:  I spent 12 minutes with the patient via telehealth technology discussing the above  problems/concerns.    Roxy Horseman, PA-C

## 2021-07-29 ENCOUNTER — Other Ambulatory Visit: Payer: Self-pay | Admitting: Advanced Practice Midwife

## 2021-07-29 DIAGNOSIS — Z1231 Encounter for screening mammogram for malignant neoplasm of breast: Secondary | ICD-10-CM

## 2021-07-29 DIAGNOSIS — N6002 Solitary cyst of left breast: Secondary | ICD-10-CM

## 2021-07-29 DIAGNOSIS — R928 Other abnormal and inconclusive findings on diagnostic imaging of breast: Secondary | ICD-10-CM

## 2021-08-05 ENCOUNTER — Other Ambulatory Visit: Payer: Self-pay

## 2021-08-05 ENCOUNTER — Ambulatory Visit
Admission: EM | Admit: 2021-08-05 | Discharge: 2021-08-05 | Disposition: A | Discharge status: Home / Self Care | Payer: BC Managed Care – PPO | Attending: Emergency Medicine | Admitting: Emergency Medicine

## 2021-08-05 DIAGNOSIS — R0789 Other chest pain: Secondary | ICD-10-CM | POA: Diagnosis not present

## 2021-08-05 MED ORDER — IBUPROFEN 600 MG PO TABS: 600.0000 mg | ORAL_TABLET | Freq: Four times a day (QID) | ORAL | 0 refills | Status: DC | PRN

## 2021-08-05 NOTE — ED Triage Notes (Signed)
Pt had left side chest and shoulder pain while on treadmill this morning, pain has improved but was 8/10 now 4/10

## 2021-08-05 NOTE — ED Provider Notes (Signed)
[facility]    CSN: [id number] Arrival date & time: 08/05/21  0859      History   Chief Complaint Chief Complaint  Patient presents with   Chest Pain    HPI Janet Jones is a 40 y.o. female.   HPI  72 old female here for evaluation of chest pain.  Patient reports that she has been experiencing some left-sided chest pain on and off for over a month.  Typically she can relax and focus on her breathing and the pain will resolve.  Today she was working out on the treadmill when she experienced left sided chest pain that went to her left shoulder and left upper back.  This was not associated with sweating or nausea.  No radiation to the jaw.  She reports that this pain was initially an 8 out of 10 and is since subsided without any intervention.  It is still present to a mild degree.  She states that the pain does increase with movement but not with deep breathing.  Patient has recently returned to the gym to improve her exercise regimen.  She states that she has been at the gym 3 days a week.  The first day she was elliptical and the last 2 days have been on the treadmill.  She does alternate walking with episodes of jogging.  Past Medical History:  Diagnosis Date   Genital herpes     There are no problems to display for this patient.   Past Surgical History:  Procedure Laterality Date   NO PAST SURGERIES      OB History     Gravida  1   Para  1   Term  1   Preterm      AB      Living  1      SAB      IAB      Ectopic      Multiple      Live Births  1            Home Medications    Prior to Admission medications   Medication Sig Start Date End Date Taking? Authorizing Provider  ibuprofen (ADVIL) 600 MG tablet Take 1 tablet (600 mg total) by mouth every 6 (six) hours as needed. 08/05/21  Yes Becky Augusta, NP  levonorgestrel-ethinyl estradiol (ALTAVERA) 0.15-30 MG-MCG tablet Take 1 tablet by mouth daily. 10/09/20   Conard Novak, MD  nitrofurantoin (MACRODANTIN) 100 MG capsule Take 1 capsule (100 mg total) by mouth daily. Patient not taking: Reported on 11/13/2020 08/25/20   Alfredo Martinez, MD  valACYclovir (VALTREX) 500 MG tablet Take 1 tablet (500 mg total) by mouth daily. 05/01/21   Tresea Mall, CNM    Family History Family History  Problem Relation Age of Onset   Hypertension Mother    Heart disease Father    Heart attack Maternal Grandfather    Lung cancer Paternal Grandmother    Prostate cancer Paternal Grandmother     Social History Social History   Tobacco Use   Smoking status: Never   Smokeless tobacco: Never  Vaping Use   Vaping Use: Never used  Substance Use Topics   Alcohol use: Not Currently   Drug use: Never     Allergies   Patient has no known allergies.   Review of Systems Review of Systems  Constitutional:  Negative for diaphoresis.  Respiratory:  Negative for cough, shortness of breath and wheezing.   Cardiovascular:  Positive for chest pain. Negative for palpitations.  Gastrointestinal:  Negative for nausea.  Hematological: Negative.   Psychiatric/Behavioral: Negative.      Physical Exam Triage Vital Signs ED Triage Vitals  Enc Vitals Group     BP 08/05/21 0917 (!) 163/90     Pulse Rate 08/05/21 0917 76     Resp 08/05/21 0917 18     Temp 08/05/21 0917 98.7 F (37.1 C)     Temp src --      SpO2 08/05/21 0917 100 %     Weight --      Height --      Head Circumference --      Peak Flow --      Pain Score 08/05/21 0915 4     Pain Loc --      Pain Edu? --      Excl. in GC? --    No data found.  Updated Vital Signs BP (!) 163/90   Pulse 76   Temp 98.7 F (37.1 C)   Resp 18   LMP 07/09/2021 (Approximate)   SpO2 100%   Visual Acuity Right Eye Distance:   Left Eye Distance:   Bilateral Distance:    Right Eye Near:   Left Eye Near:    Bilateral Near:     Physical Exam Vitals and nursing note reviewed.  Constitutional:      General: She is not  in acute distress.    Appearance: Normal appearance. She is normal weight. She is not ill-appearing.  HENT:     Head: Normocephalic and atraumatic.  Cardiovascular:     Rate and Rhythm: Normal rate and regular rhythm.     Pulses: Normal pulses.     Heart sounds: Normal heart sounds. No murmur heard.   No gallop.  Pulmonary:     Effort: Pulmonary effort is normal.     Breath sounds: Normal breath sounds. No wheezing, rhonchi or rales.  Skin:    General: Skin is warm and dry.     Capillary Refill: Capillary refill takes less than 2 seconds.     Findings: No erythema or rash.  Neurological:     General: No focal deficit present.     Mental Status: She is alert and oriented to person, place, and time.  Psychiatric:        Mood and Affect: Mood normal.        Behavior: Behavior normal.        Thought Content: Thought content normal.        Judgment: Judgment normal.     UC Treatments / Results  Labs (all labs ordered are listed, but only abnormal results are displayed) Labs Reviewed - No data to display  EKG Normal sinus rhythm with a ventricular rate of 77 bpm Peer interval 156 ms QRS duration 70 ms QT/QTc 386/436 ms. No ST elevation or depression noted.   Radiology No results found.  Procedures Procedures (including critical care time)  Medications Ordered in UC Medications - No data to display  Initial Impression / Assessment and Plan / UC Course  I have reviewed the triage vital signs and the nursing notes.  Pertinent labs & imaging results that were available during my care of the patient were reviewed by me and considered in my medical decision making (see chart for details).  Is a very pleasant, nontoxic-appearing 40 year old female here for evaluation of left-sided chest pain that began while she was working on the treadmill this morning.  The pain is still present but is greatly receded from her initial 8/10.  This not associate with nausea, sweating, or  dizziness.  The pain does increase with movement but not deep breathing.  Patient is not had any history of cardiac issues though she does report that she has had this left-sided chest pain intermittently for the past month.  Typically it resolves with relaxation techniques and deep breathing.  She came today because she wanted to be checked out as she is recently started an exercise routine.  Has no significant past medical history and she is not a smoker or drinker.  She does have a family history of hypertension and heart disease.  Patient's physical exam reveals a benign cardiopulmonary exam with S1-S2 heart sounds that are free of murmur, rub, gallop, or ectopy.  Lung sounds are clear to auscultation all fields.  Patient's peripheral pulses are 2+ globally.  No peripheral edema noted.  Patient does have an increase in her pain with palpation of the left chest wall with extension up into the left shoulder and left trapezius.  No pain with palpation of the bicep or tricep muscle.  No pain with palpation of the deltoid muscle.  Patient states that her pain increases with resisted flexion but not extension of her right upper arm.  Patient exam is consistent with musculoskeletal chest pain.  EKG was performed at triage and is unremarkable.  Will treat with ibuprofen, moist heat, rest, and have patient increase her oral fluid intake and also perform stretching before working out.   Final Clinical Impressions(s) / UC Diagnoses   Final diagnoses:  Chest wall pain     Discharge Instructions      Take the ibuprofen, 600 mg every 6 hours with food, on a schedule for the next 48 hours and then as needed.Marland Kitchen  Apply moist heat to your chest wall for 30 minutes at a time 2-3 times a day to improve blood flow to the area and help remove the lactic acid causing the spasm.  Stretch completely prior to exercise.  Increase your water intake to a ghaol of 8-8 ounce glasses daily to improve hydration.   Return for  reevaluation for any new or worsening symptoms.      ED Prescriptions     Medication Sig Dispense Auth. Provider   ibuprofen (ADVIL) 600 MG tablet Take 1 tablet (600 mg total) by mouth every 6 (six) hours as needed. 30 tablet Becky Augusta, NP      PDMP not reviewed this encounter.   Becky Augusta, NP 08/05/21 1020

## 2021-08-05 NOTE — Discharge Instructions (Addendum)
Take the ibuprofen, 600 mg every 6 hours with food, on a schedule for the next 48 hours and then as needed.Marland Kitchen  Apply moist heat to your chest wall for 30 minutes at a time 2-3 times a day to improve blood flow to the area and help remove the lactic acid causing the spasm.  Stretch completely prior to exercise.  Increase your water intake to a ghaol of 8-8 ounce glasses daily to improve hydration.   Return for reevaluation for any new or worsening symptoms.

## 2021-08-10 ENCOUNTER — Ambulatory Visit
Admission: RE | Admit: 2021-08-10 | Discharge: 2021-08-10 | Disposition: A | Discharge status: Home / Self Care | Payer: BC Managed Care – PPO | Source: Ambulatory Visit | Attending: Advanced Practice Midwife | Admitting: Advanced Practice Midwife

## 2021-08-10 ENCOUNTER — Other Ambulatory Visit: Payer: Self-pay

## 2021-08-10 DIAGNOSIS — Z1231 Encounter for screening mammogram for malignant neoplasm of breast: Secondary | ICD-10-CM | POA: Insufficient documentation

## 2021-08-10 DIAGNOSIS — R928 Other abnormal and inconclusive findings on diagnostic imaging of breast: Secondary | ICD-10-CM | POA: Diagnosis not present

## 2021-08-10 DIAGNOSIS — R922 Inconclusive mammogram: Secondary | ICD-10-CM | POA: Diagnosis not present

## 2021-08-10 DIAGNOSIS — N6002 Solitary cyst of left breast: Secondary | ICD-10-CM | POA: Diagnosis not present

## 2021-10-11 NOTE — Progress Notes (Signed)
PCP:  Patient, No Pcp Per (Inactive)   Chief Complaint  Patient presents with   Gynecologic Exam    No concerns     HPI:      [name] is a 40 y.o. G1P1001 whose LMP was Patient's last menstrual period was 10/01/2021 (exact date)., presents today for her annual examination.  Her menses are regular every 28-30 days, lasting 5 days.  Dysmenorrhea none. She does not have intermenstrual bleeding. On OCPs for period improvement.  Sex activity: not sexually active.  Last Pap: 07/30/20 Results were: no abnormalities /neg HPV DNA  Hx of STDs: HSV, takes valtrex daily with sx control, needs RF  Last mammogram: 08/10/21 Results were: normal--routine follow-up in 12 months with stable LT breast cyst There is no FH of breast cancer. There is no FH of ovarian cancer. The patient does not do self-breast exams.  Tobacco use: The patient denies current or previous tobacco use. Alcohol use: none No drug use.  Exercise: moderately active  She does get adequate calcium but not Vitamin D in her diet. 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    Past Surgical History:  Procedure Laterality Date   NO PAST SURGERIES      Family History  Problem Relation Age of Onset   Hypertension Mother    Heart disease Father    Heart attack Maternal Grandfather    Lung cancer Paternal Grandmother    Prostate cancer Paternal Grandmother     Social History   Socioeconomic History   Marital status: Single    Spouse name: Not on file   Number of children: Not on file   Years of education: Not on file   Highest education level: Not on file  Occupational History   Not on file  Tobacco Use   Smoking status: Never   Smokeless tobacco: Never  Vaping Use   Vaping Use: Never used  Substance and Sexual Activity   Alcohol use: Not Currently   Drug use: Never   Sexual activity: Not Currently    Birth  control/protection: Pill  Other Topics Concern   Not on file  Social History Narrative   Not on file   Social Determinants of Health   Financial Resource Strain: Not on file  Food Insecurity: Not on file  Transportation Needs: Not on file  Physical Activity: Not on file  Stress: Not on file  Social Connections: Not on file  Intimate Partner Violence: Not on file     Current Outpatient Medications:    ibuprofen (ADVIL) 600 MG tablet, Take 1 tablet (600 mg total) by mouth every 6 (six) hours as needed., Disp: 30 tablet, Rfl: 0   levonorgestrel-ethinyl estradiol (ALTAVERA) 0.15-30 MG-MCG tablet, Take 1 tablet by mouth daily., Disp: 84 tablet, Rfl: 3   valACYclovir (VALTREX) 500 MG tablet, Take 1 tablet (500 mg total) by mouth daily., Disp: 90 tablet, Rfl: 3     ROS:  Review of Systems  Constitutional:  Negative for fatigue, fever and unexpected weight change.  Respiratory:  Negative for cough, shortness of breath and wheezing.   Cardiovascular:  Negative for chest pain, palpitations and leg swelling.  Gastrointestinal:  Negative for blood in stool, constipation, diarrhea, nausea and vomiting.  Endocrine: Negative for cold intolerance, heat intolerance and polyuria.  Genitourinary:  Negative for dyspareunia, dysuria, flank pain, frequency, genital sores, hematuria, menstrual problem, pelvic pain, urgency, vaginal  bleeding, vaginal discharge and vaginal pain.  Musculoskeletal:  Negative for back pain, joint swelling and myalgias.  Skin:  Negative for rash.  Neurological:  Negative for dizziness, syncope, light-headedness, numbness and headaches.  Hematological:  Negative for adenopathy.  Psychiatric/Behavioral:  Negative for agitation, confusion, sleep disturbance and suicidal ideas. The patient is not nervous/anxious.   BREAST: No symptoms   Objective: BP 104/70    Ht 5\' 4"  (1.626 m)    Wt 180 lb (81.6 kg)    LMP 10/01/2021 (Exact Date)    BMI 30.90 kg/m    Physical  Exam Constitutional:      Appearance: She is well-developed.  Genitourinary:     Vulva normal.     Right Labia: No rash, tenderness or lesions.    Left Labia: No tenderness, lesions or rash.    No vaginal discharge, erythema or tenderness.      Right Adnexa: not tender and no mass present.    Left Adnexa: not tender and no mass present.    No cervical friability or polyp.     Uterus is not enlarged or tender.  Breasts:    Right: No mass, nipple discharge, skin change or tenderness.     Left: No mass, nipple discharge, skin change or tenderness.  Neck:     Thyroid: No thyromegaly.  Cardiovascular:     Rate and Rhythm: Normal rate and regular rhythm.     Heart sounds: Normal heart sounds. No murmur heard. Pulmonary:     Effort: Pulmonary effort is normal.     Breath sounds: Normal breath sounds.  Abdominal:     Palpations: Abdomen is soft.     Tenderness: There is no abdominal tenderness. There is no guarding or rebound.  Musculoskeletal:        General: Normal range of motion.     Cervical back: Normal range of motion.  Lymphadenopathy:     Cervical: No cervical adenopathy.  Neurological:     General: No focal deficit present.     Mental Status: She is alert and oriented to person, place, and time.     Cranial Nerves: No cranial nerve deficit.  Skin:    General: Skin is warm and dry.  Psychiatric:        Mood and Affect: Mood normal.        Behavior: Behavior normal.        Thought Content: Thought content normal.        Judgment: Judgment normal.  Vitals reviewed.    Assessment/Plan: Encounter for annual routine gynecological examination  Encounter for screening mammogram for malignant neoplasm of breast - Plan: MM 3D SCREEN BREAST BILATERAL; pt to sched mammo  Encounter for surveillance of contraceptive pills - Plan: levonorgestrel-ethinyl estradiol (ALTAVERA) 0.15-30 MG-MCG tablet; OCP RF  Herpes simplex vulvovaginitis - Plan: valACYclovir (VALTREX) 500 MG  tablet; OCP RF  Meds ordered this encounter  Medications   valACYclovir (VALTREX) 500 MG tablet    Sig: Take 1 tablet (500 mg total) by mouth daily.    Dispense:  90 tablet    Refill:  3    Order Specific Question:   Supervising Provider    Answer:   Gae Dry [id number]   levonorgestrel-ethinyl estradiol (ALTAVERA) 0.15-30 MG-MCG tablet    Sig: Take 1 tablet by mouth daily.    Dispense:  84 tablet    Refill:  3    Order Specific Question:   Supervising Provider    Answer:   Kenton Kingfisher,  Harrel Lemon [[id number]]             GYN counsel breast self exam, mammography screening, adequate intake of calcium and vitamin D, diet and exercise     F/U  Return in about 1 year (around 10/12/2022).  Breon Rehm B. Kelson Queenan, PA-C 10/12/2021 10:37 AM

## 2021-10-12 ENCOUNTER — Ambulatory Visit (INDEPENDENT_AMBULATORY_CARE_PROVIDER_SITE_OTHER): Payer: BC Managed Care – PPO | Admitting: Obstetrics and Gynecology

## 2021-10-12 ENCOUNTER — Other Ambulatory Visit: Payer: Self-pay

## 2021-10-12 ENCOUNTER — Encounter: Payer: Self-pay | Admitting: Obstetrics and Gynecology

## 2021-10-12 VITALS — BP 104/70 | Ht 64.0 in | Wt 180.0 lb

## 2021-10-12 DIAGNOSIS — Z1231 Encounter for screening mammogram for malignant neoplasm of breast: Secondary | ICD-10-CM

## 2021-10-12 DIAGNOSIS — Z3041 Encounter for surveillance of contraceptive pills: Secondary | ICD-10-CM

## 2021-10-12 DIAGNOSIS — Z01419 Encounter for gynecological examination (general) (routine) without abnormal findings: Secondary | ICD-10-CM | POA: Diagnosis not present

## 2021-10-12 DIAGNOSIS — A6004 Herpesviral vulvovaginitis: Secondary | ICD-10-CM | POA: Insufficient documentation

## 2021-10-12 MED ORDER — VALACYCLOVIR HCL 500 MG PO TABS: 500.0000 mg | ORAL_TABLET | Freq: Every day | ORAL | 3 refills | Status: DC

## 2021-10-12 MED ORDER — LEVONORGESTREL-ETHINYL ESTRAD 0.15-30 MG-MCG PO TABS: 1.0000 | ORAL_TABLET | Freq: Every day | ORAL | 3 refills | Status: DC

## 2021-10-12 NOTE — Patient Instructions (Signed)
I value your feedback and you entrusting us with your care. If you get a [facility] patient survey, I would appreciate you taking the time to let us know about your experience today. Thank you! ? ? ?

## 2022-07-20 IMAGING — MG DIGITAL DIAGNOSTIC BILAT W/ TOMO W/ CAD
6 of 10 series · 6 of 30 positions shown · non-contrast
Comparison: Prior films

CLINICAL DATA: Short-term follow-up left breast mass.

EXAM:
DIGITAL DIAGNOSTIC BILATERAL MAMMOGRAM WITH TOMOSYNTHESIS AND CAD;
ULTRASOUND LEFT BREAST LIMITED
TECHNIQUE: Bilateral digital diagnostic mammography and breast tomosynthesis
was performed. The images were evaluated with computer-aided
detection.; Targeted ultrasound examination of the left breast was
performed.

[R MLO synth-2D (1 of 2)]
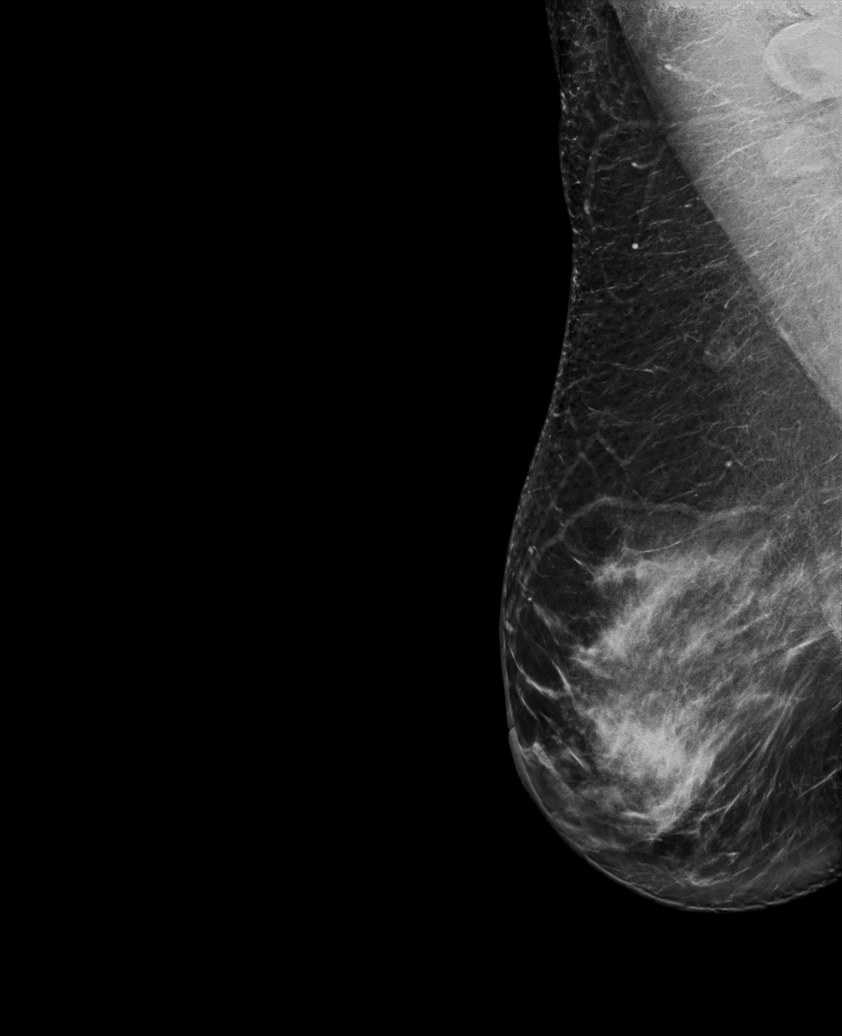

[L CC synth-2D]
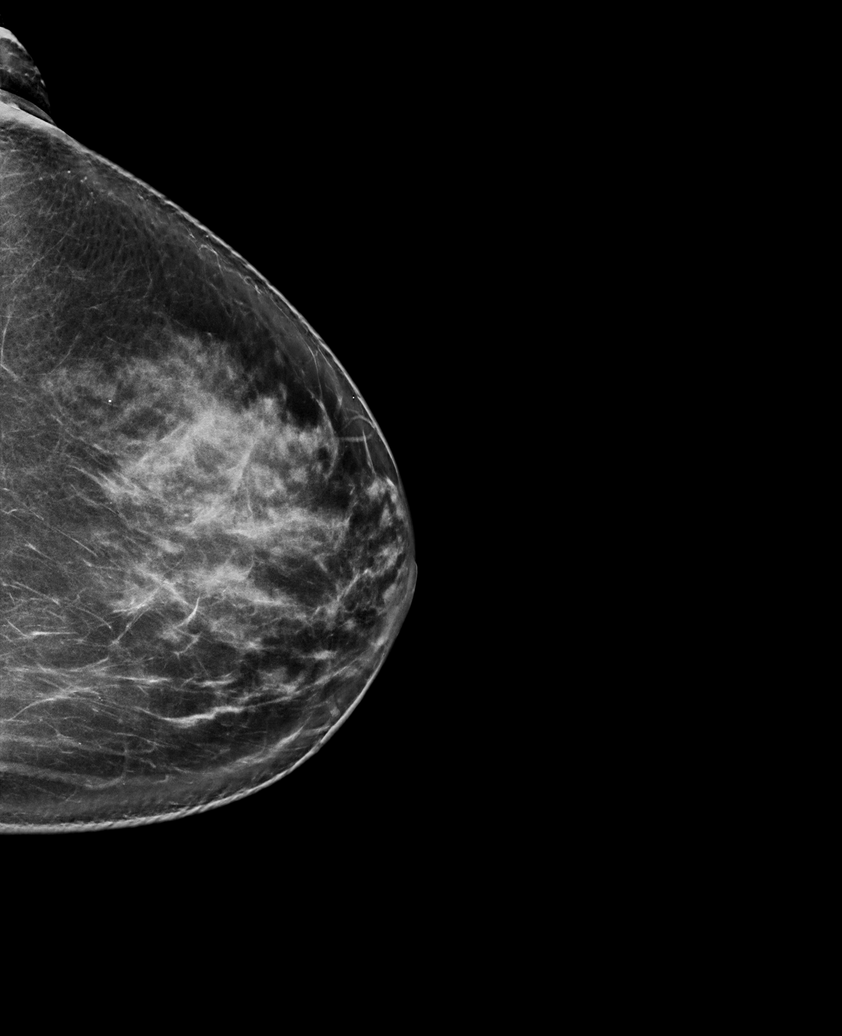

[R CC synth-2D]
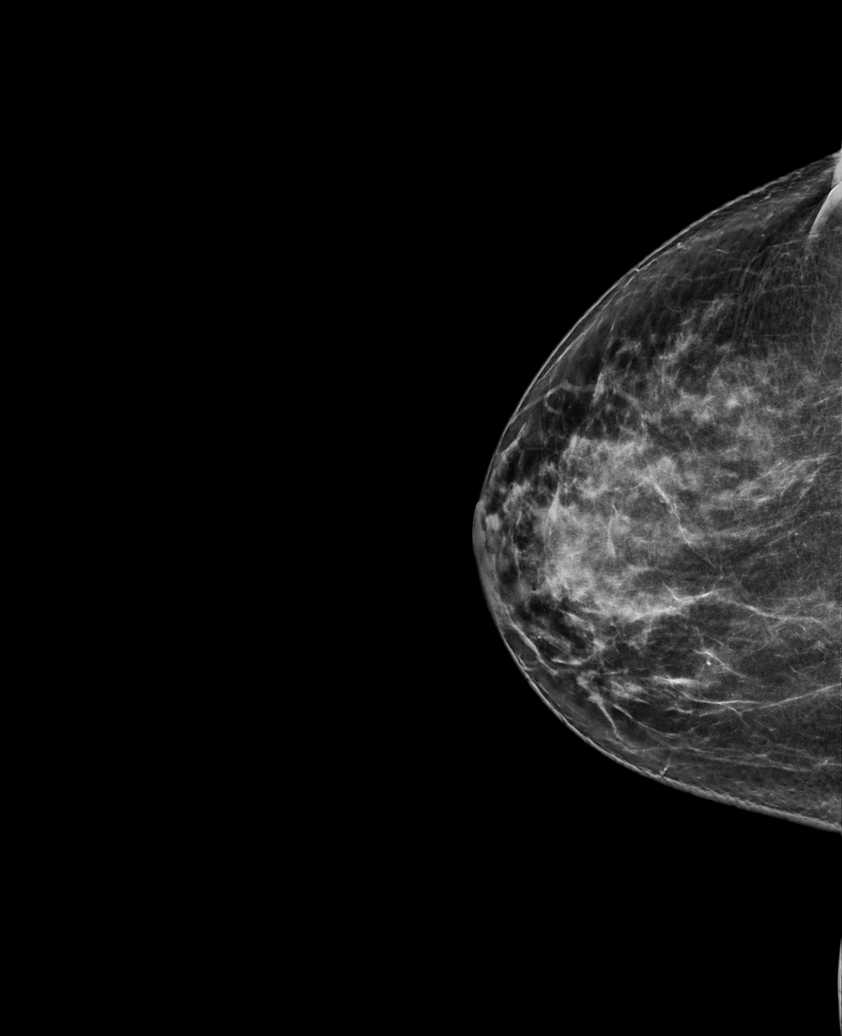

[L MLO synth-2D]
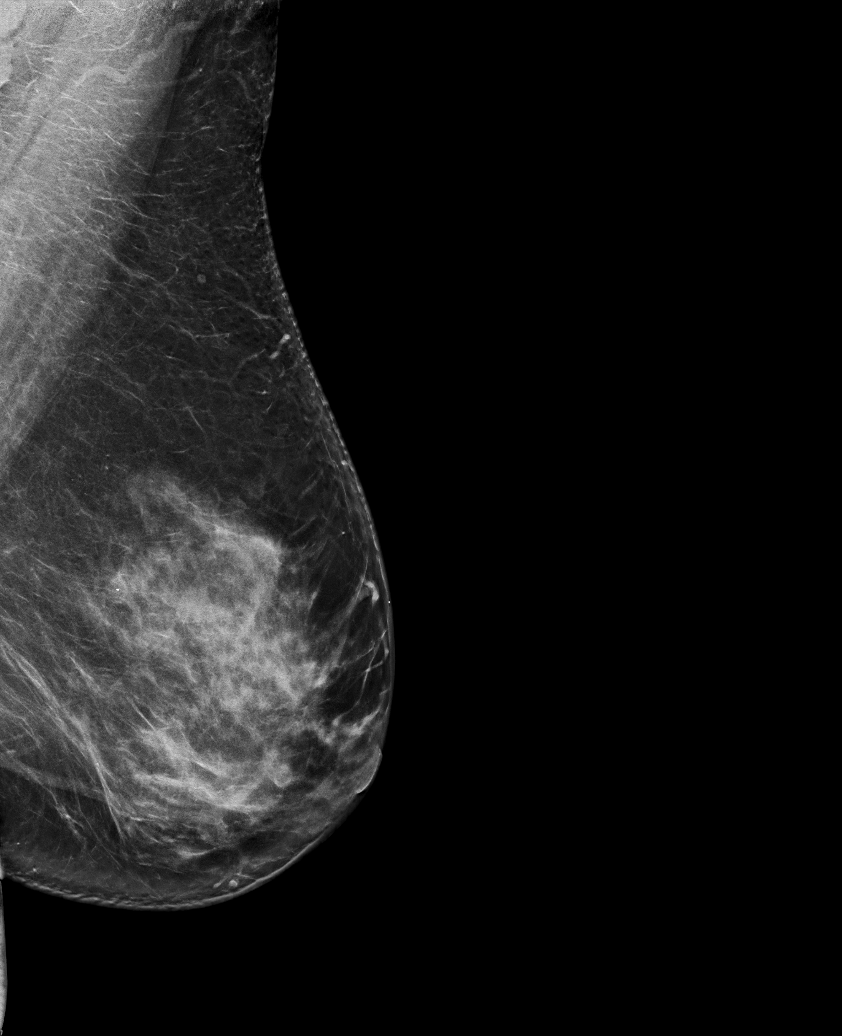

[R MLO synth-2D (2 of 2)]
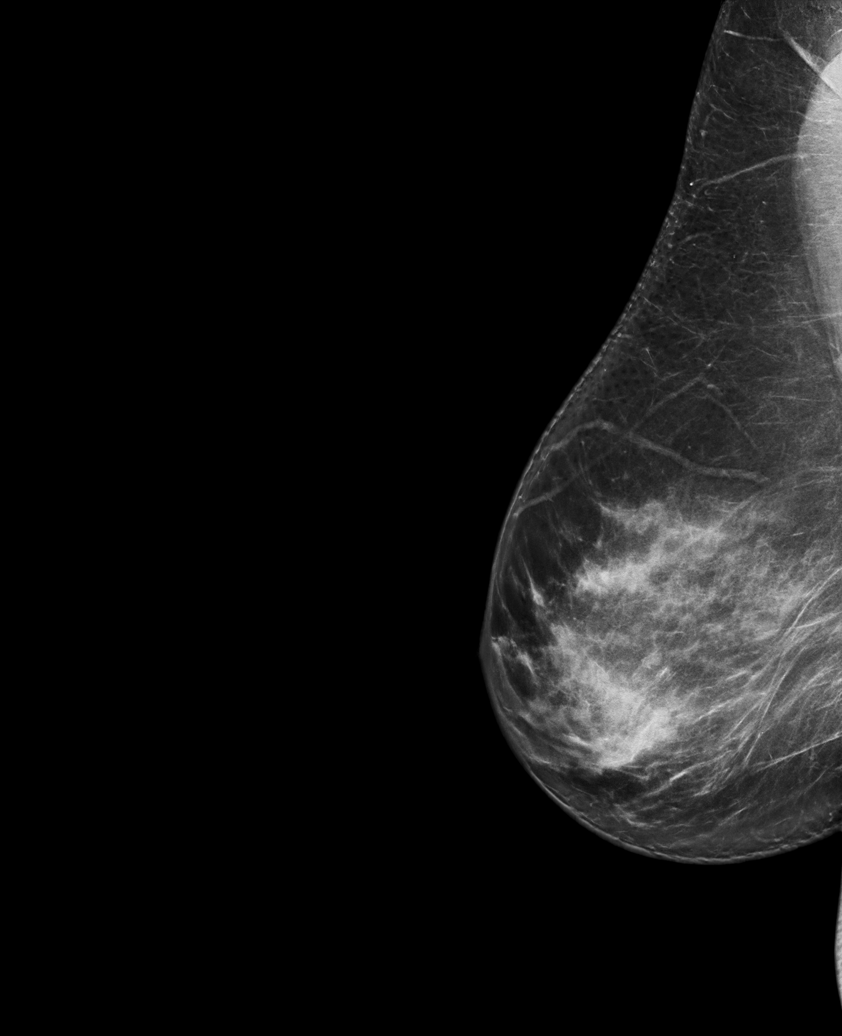

[R MLO tomo · tomo slice 44/87.0]
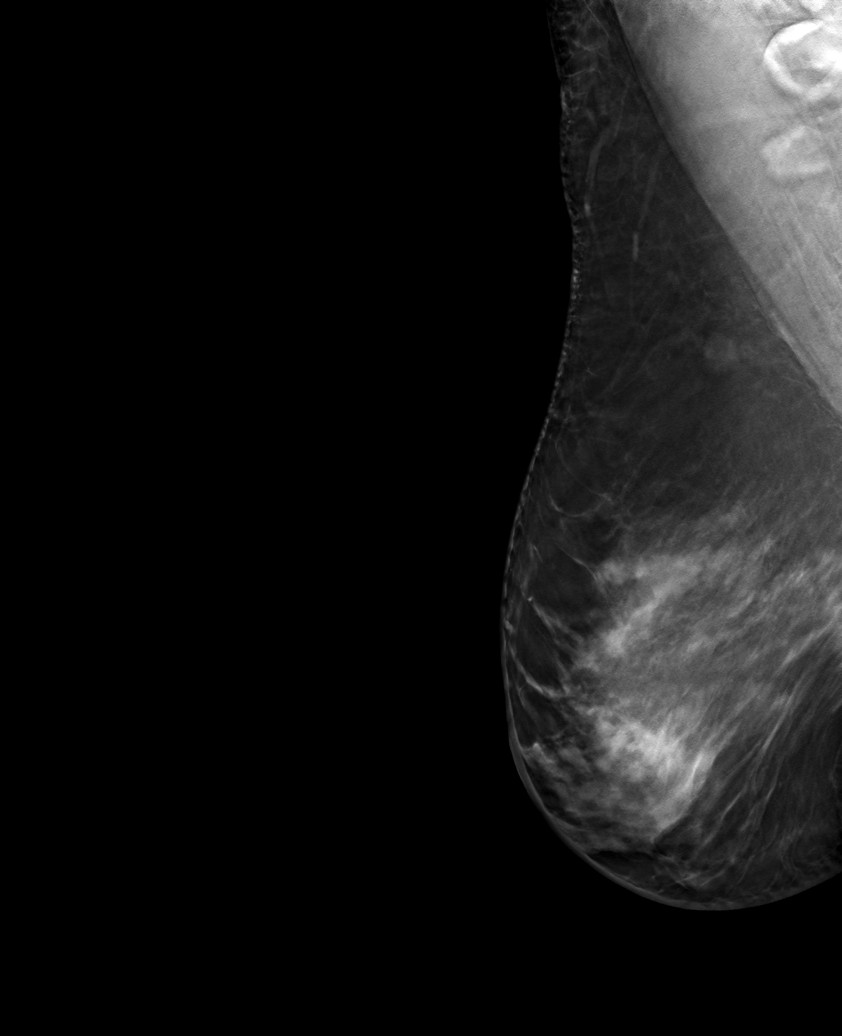

[6 of 30 positions shown; findings below may reference images not displayed]

ACR Breast Density Category c: The breast tissue is heterogeneously
dense, which may obscure small masses.
FINDINGS: Cc and MLO views of bilateral breasts are submitted. No suspicious
abnormality is identified bilaterally.

Targeted ultrasound is performed, showing 7 mm minimal complicated
cyst at the left breast 7 o'clock 3 cm from nipple unchanged
compared prior ultrasound.
IMPRESSION: Benign findings.

RECOMMENDATION:
Routine screening mammogram in 1 year.

I have discussed the findings and recommendations with the patient.
If applicable, a reminder letter will be sent to the patient
regarding the next appointment.

BI-RADS CATEGORY  2: Benign.

## 2022-08-11 ENCOUNTER — Ambulatory Visit: Payer: BC Managed Care – PPO

## 2022-08-11 ENCOUNTER — Ambulatory Visit
Admission: RE | Admit: 2022-08-11 | Discharge: 2022-08-11 | Disposition: A | Discharge status: Home / Self Care | Payer: Commercial Managed Care - PPO | Source: Ambulatory Visit | Attending: Obstetrics and Gynecology | Admitting: Obstetrics and Gynecology

## 2022-08-11 DIAGNOSIS — Z1231 Encounter for screening mammogram for malignant neoplasm of breast: Secondary | ICD-10-CM | POA: Insufficient documentation

## 2022-11-22 ENCOUNTER — Ambulatory Visit: Payer: Commercial Managed Care - PPO | Admitting: Obstetrics and Gynecology

## 2022-11-24 ENCOUNTER — Other Ambulatory Visit: Payer: Self-pay | Admitting: Obstetrics and Gynecology

## 2022-11-24 DIAGNOSIS — Z3041 Encounter for surveillance of contraceptive pills: Secondary | ICD-10-CM

## 2022-12-15 ENCOUNTER — Other Ambulatory Visit: Payer: Self-pay | Admitting: Obstetrics and Gynecology

## 2022-12-15 DIAGNOSIS — A6004 Herpesviral vulvovaginitis: Secondary | ICD-10-CM

## 2022-12-29 NOTE — Progress Notes (Unsigned)
PCP:  Patient, No Pcp Per   No chief complaint on file.    HPI:      [name] is a 42 y.o. G1P1001 whose LMP was No LMP recorded., presents today for her annual examination.  Her menses are regular every 28-30 days, lasting 5 days.  Dysmenorrhea none. She does not have intermenstrual bleeding. On OCPs for period improvement.  Sex activity: not sexually active.  Last Pap: 07/30/20 Results were: no abnormalities /neg HPV DNA  Hx of STDs: HSV, takes valtrex daily with sx control, needs RF  Last mammogram: 08/11/22 Results were: normal--routine follow-up in 12 months with stable LT breast cyst There is no FH of breast cancer. There is no FH of ovarian cancer. The patient does not do self-breast exams.  Tobacco use: The patient denies current or previous tobacco use. Alcohol use: none No drug use.  Exercise: moderately active  She does get adequate calcium but not Vitamin D in her diet. 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    Past Surgical History:  Procedure Laterality Date   NO PAST SURGERIES      Family History  Problem Relation Age of Onset   Hypertension Mother    Heart disease Father    Heart attack Maternal Grandfather    Lung cancer Paternal Grandmother    Prostate cancer Paternal Grandmother    Breast cancer Neg Hx     Social History   Socioeconomic History   Marital status: Single    Spouse name: Not on file   Number of children: Not on file   Years of education: Not on file   Highest education level: Not on file  Occupational History   Not on file  Tobacco Use   Smoking status: Never   Smokeless tobacco: Never  Vaping Use   Vaping Use: Never used  Substance and Sexual Activity   Alcohol use: Not Currently   Drug use: Never   Sexual activity: Not Currently    Birth control/protection: Pill  Other Topics Concern   Not on file  Social History Narrative    Not on file   Social Determinants of Health   Financial Resource Strain: Not on file  Food Insecurity: Not on file  Transportation Needs: Not on file  Physical Activity: Not on file  Stress: Not on file  Social Connections: Not on file  Intimate Partner Violence: Not on file     Current Outpatient Medications:    ibuprofen (ADVIL) 600 MG tablet, Take 1 tablet (600 mg total) by mouth every 6 (six) hours as needed., Disp: 30 tablet, Rfl: 0   levonorgestrel-ethinyl estradiol (ALTAVERA) 0.15-30 MG-MCG tablet, TAKE 1 TABLET BY MOUTH EVERY DAY, Disp: 84 tablet, Rfl: 0   valACYclovir (VALTREX) 500 MG tablet, TAKE 1 TABLET (500 MG TOTAL) BY MOUTH DAILY., Disp: 90 tablet, Rfl: 0     ROS:  Review of Systems  Constitutional:  Negative for fatigue, fever and unexpected weight change.  Respiratory:  Negative for cough, shortness of breath and wheezing.   Cardiovascular:  Negative for chest pain, palpitations and leg swelling.  Gastrointestinal:  Negative for blood in stool, constipation, diarrhea, nausea and vomiting.  Endocrine: Negative for cold intolerance, heat intolerance and polyuria.  Genitourinary:  Negative for dyspareunia, dysuria, flank pain, frequency, genital sores, hematuria, menstrual problem, pelvic pain, urgency, vaginal bleeding, vaginal discharge and vaginal pain.  Musculoskeletal:  Negative for back pain, joint swelling and myalgias.  Skin:  Negative for rash.  Neurological:  Negative for dizziness, syncope, light-headedness, numbness and headaches.  Hematological:  Negative for adenopathy.  Psychiatric/Behavioral:  Negative for agitation, confusion, sleep disturbance and suicidal ideas. The patient is not nervous/anxious.    BREAST: No symptoms   Objective: There were no vitals taken for this visit.   Physical Exam Constitutional:      Appearance: She is well-developed.  Genitourinary:     Vulva normal.     Right Labia: No rash, tenderness or lesions.    Left  Labia: No tenderness, lesions or rash.    No vaginal discharge, erythema or tenderness.      Right Adnexa: not tender and no mass present.    Left Adnexa: not tender and no mass present.    No cervical friability or polyp.     Uterus is not enlarged or tender.  Breasts:    Right: No mass, nipple discharge, skin change or tenderness.     Left: No mass, nipple discharge, skin change or tenderness.  Neck:     Thyroid: No thyromegaly.  Cardiovascular:     Rate and Rhythm: Normal rate and regular rhythm.     Heart sounds: Normal heart sounds. No murmur heard. Pulmonary:     Effort: Pulmonary effort is normal.     Breath sounds: Normal breath sounds.  Abdominal:     Palpations: Abdomen is soft.     Tenderness: There is no abdominal tenderness. There is no guarding or rebound.  Musculoskeletal:        General: Normal range of motion.     Cervical back: Normal range of motion.  Lymphadenopathy:     Cervical: No cervical adenopathy.  Neurological:     General: No focal deficit present.     Mental Status: She is alert and oriented to person, place, and time.     Cranial Nerves: No cranial nerve deficit.  Skin:    General: Skin is warm and dry.  Psychiatric:        Mood and Affect: Mood normal.        Behavior: Behavior normal.        Thought Content: Thought content normal.        Judgment: Judgment normal.  Vitals reviewed.     Assessment/Plan: Encounter for annual routine gynecological examination  Encounter for screening mammogram for malignant neoplasm of breast - Plan: MM 3D SCREEN BREAST BILATERAL; pt to sched mammo  Encounter for surveillance of contraceptive pills - Plan: levonorgestrel-ethinyl estradiol (ALTAVERA) 0.15-30 MG-MCG tablet; OCP RF  Herpes simplex vulvovaginitis - Plan: valACYclovir (VALTREX) 500 MG tablet; OCP RF  No orders of the defined types were placed in this encounter.            GYN counsel breast self exam, mammography screening, adequate  intake of calcium and vitamin D, diet and exercise     F/U  No follow-ups on file.  Clydine Parkison B. Bert Givans, PA-C 12/29/2022 4:29 PM

## 2022-12-30 ENCOUNTER — Ambulatory Visit (INDEPENDENT_AMBULATORY_CARE_PROVIDER_SITE_OTHER): Payer: Commercial Managed Care - PPO | Admitting: Obstetrics and Gynecology

## 2022-12-30 ENCOUNTER — Encounter: Payer: Self-pay | Admitting: Obstetrics and Gynecology

## 2022-12-30 VITALS — BP 110/70 | Ht 64.0 in | Wt 159.0 lb

## 2022-12-30 DIAGNOSIS — Z3041 Encounter for surveillance of contraceptive pills: Secondary | ICD-10-CM

## 2022-12-30 DIAGNOSIS — Z1231 Encounter for screening mammogram for malignant neoplasm of breast: Secondary | ICD-10-CM

## 2022-12-30 DIAGNOSIS — Z01419 Encounter for gynecological examination (general) (routine) without abnormal findings: Secondary | ICD-10-CM

## 2022-12-30 DIAGNOSIS — A6004 Herpesviral vulvovaginitis: Secondary | ICD-10-CM

## 2022-12-30 MED ORDER — VALACYCLOVIR HCL 500 MG PO TABS: 500.0000 mg | ORAL_TABLET | Freq: Every day | ORAL | 3 refills | Status: DC

## 2022-12-30 MED ORDER — LEVONORGESTREL-ETHINYL ESTRAD 0.15-30 MG-MCG PO TABS: 1.0000 | ORAL_TABLET | Freq: Every day | ORAL | 3 refills | Status: DC

## 2022-12-30 NOTE — Patient Instructions (Signed)
I value your feedback and you entrusting us with your care. If you get a [place] [facility] patient survey, I would appreciate you taking the time to let us know about your experience today. Thank you! ? ? ?

## 2023-08-30 ENCOUNTER — Ambulatory Visit
Admission: RE | Admit: 2023-08-30 | Discharge: 2023-08-30 | Disposition: A | Discharge status: Home / Self Care | Payer: Commercial Managed Care - PPO | Source: Ambulatory Visit | Attending: Obstetrics and Gynecology | Admitting: Obstetrics and Gynecology

## 2023-08-30 DIAGNOSIS — Z1231 Encounter for screening mammogram for malignant neoplasm of breast: Secondary | ICD-10-CM | POA: Diagnosis present

## 2024-01-06 ENCOUNTER — Other Ambulatory Visit: Payer: Self-pay | Admitting: Obstetrics and Gynecology

## 2024-01-06 ENCOUNTER — Other Ambulatory Visit: Payer: Self-pay

## 2024-01-06 DIAGNOSIS — Z3041 Encounter for surveillance of contraceptive pills: Secondary | ICD-10-CM

## 2024-01-06 DIAGNOSIS — A6004 Herpesviral vulvovaginitis: Secondary | ICD-10-CM

## 2024-01-06 MED ORDER — VALACYCLOVIR HCL 500 MG PO TABS: 500.0000 mg | ORAL_TABLET | Freq: Every day | ORAL | 0 refills | Status: DC

## 2024-01-06 MED ORDER — LEVONORGESTREL-ETHINYL ESTRAD 0.15-30 MG-MCG PO TABS: 1.0000 | ORAL_TABLET | Freq: Every day | ORAL | 0 refills | Status: DC

## 2024-01-19 ENCOUNTER — Other Ambulatory Visit: Payer: Self-pay | Admitting: Obstetrics and Gynecology

## 2024-01-19 DIAGNOSIS — Z3041 Encounter for surveillance of contraceptive pills: Secondary | ICD-10-CM

## 2024-01-30 NOTE — Progress Notes (Unsigned)
 PCP:  Patient, No Pcp Per   No chief complaint on file.    HPI:      [name] is a 43 y.o. G1P1001 whose LMP was No LMP recorded., presents today for her annual examination.  Her menses are regular every 28-30 days, lasting 4-5 days.  Dysmenorrhea none. She does not have intermenstrual bleeding. On OCPs for period improvement.  Sex activity: not sexually active. No vag sx. Last Pap: 07/30/20 Results were: no abnormalities /neg HPV DNA  Hx of STDs: HSV, takes valtrex daily with sx control, needs RF  Last mammogram: 08/30/23 Results were: normal--routine follow-up in 12 months with stable LT breast cyst There is a FH of breast cancer in her pat aunt, genetic testing not indicated. There is no FH of ovarian cancer. The patient does not do self-breast exams.  Tobacco use: The patient denies current or previous tobacco use. Alcohol use: none No drug use.  Exercise: moderately active  Colonoscopy: never  She does get adequate calcium but not Vitamin D in her diet. Labs with PCP  Patient Active Problem List   Diagnosis Date Noted   Herpes simplex vulvovaginitis 10/12/2021    Past Surgical History:  Procedure Laterality Date   NO PAST SURGERIES      Family History  Problem Relation Age of Onset   Hypertension Mother    Heart disease Father    Heart attack Maternal Grandfather    Lung cancer Paternal Grandmother    Prostate cancer Paternal Grandmother    Breast cancer Paternal Aunt 43    Social History   Socioeconomic History   Marital status: Single    Spouse name: Not on file   Number of children: Not on file   Years of education: Not on file   Highest education level: Not on file  Occupational History   Not on file  Tobacco Use   Smoking status: Never   Smokeless tobacco: Never  Vaping Use   Vaping status: Never Used  Substance and Sexual Activity   Alcohol use: Not Currently   Drug use: Never   Sexual activity: Not Currently    Birth  control/protection: Pill  Other Topics Concern   Not on file  Social History Narrative   Not on file   Social Drivers of Health   Financial Resource Strain: Low Risk  (10/10/2023)   Received from [facility]   Overall Financial Resource Strain (CARDIA)    Difficulty of Paying Living Expenses: Not very hard  Food Insecurity: No Food Insecurity (10/10/2023)   Received from [facility]   Hunger Vital Sign    Worried About Running Out of Food in the Last Year: Never true    Ran Out of Food in the Last Year: Never true  Transportation Needs: No Transportation Needs (10/10/2023)   Received from [facility] - Transportation    In the past 12 months, has lack of transportation kept you from medical appointments or from getting medications?: No    Lack of Transportation (Non-Medical): No  Physical Activity: Not on file  Stress: Not on file  Social Connections: Not on file  Intimate Partner Violence: Not on file     Current Outpatient Medications:    levonorgestrel-ethinyl estradiol (ALTAVERA) 0.15-30 MG-MCG tablet, Take 1 tablet by mouth daily., Disp: 84 tablet, Rfl: 0   valACYclovir (VALTREX) 500 MG tablet, Take 1 tablet (500 mg total) by mouth daily., Disp: 90  tablet, Rfl: 0     ROS:  Review of Systems  Constitutional:  Negative for fatigue, fever and unexpected weight change.  Respiratory:  Negative for cough, shortness of breath and wheezing.   Cardiovascular:  Negative for chest pain, palpitations and leg swelling.  Gastrointestinal:  Negative for blood in stool, constipation, diarrhea, nausea and vomiting.  Endocrine: Negative for cold intolerance, heat intolerance and polyuria.  Genitourinary:  Negative for dyspareunia, dysuria, flank pain, frequency, genital sores, hematuria, menstrual problem, pelvic pain, urgency, vaginal bleeding, vaginal discharge and vaginal pain.  Musculoskeletal:  Negative for back  pain, joint swelling and myalgias.  Skin:  Negative for rash.  Neurological:  Negative for dizziness, syncope, light-headedness, numbness and headaches.  Hematological:  Negative for adenopathy.  Psychiatric/Behavioral:  Negative for agitation, confusion, sleep disturbance and suicidal ideas. The patient is not nervous/anxious.    BREAST: No symptoms   Objective: There were no vitals taken for this visit.   Physical Exam Constitutional:      Appearance: She is well-developed.  Genitourinary:     Vulva normal.     Right Labia: No rash, tenderness or lesions.    Left Labia: No tenderness, lesions or rash.    No vaginal discharge, erythema or tenderness.      Right Adnexa: not tender and no mass present.    Left Adnexa: not tender and no mass present.    No cervical friability or polyp.     Uterus is not enlarged or tender.  Breasts:    Right: No mass, nipple discharge, skin change or tenderness.     Left: No mass, nipple discharge, skin change or tenderness.  Neck:     Thyroid: No thyromegaly.  Cardiovascular:     Rate and Rhythm: Normal rate and regular rhythm.     Heart sounds: Normal heart sounds. No murmur heard. Pulmonary:     Effort: Pulmonary effort is normal.     Breath sounds: Normal breath sounds.  Abdominal:     Palpations: Abdomen is soft.     Tenderness: There is no abdominal tenderness. There is no guarding or rebound.  Musculoskeletal:        General: Normal range of motion.     Cervical back: Normal range of motion.  Lymphadenopathy:     Cervical: No cervical adenopathy.  Neurological:     General: No focal deficit present.     Mental Status: She is alert and oriented to person, place, and time.     Cranial Nerves: No cranial nerve deficit.  Skin:    General: Skin is warm and dry.  Psychiatric:        Mood and Affect: Mood normal.        Behavior: Behavior normal.        Thought Content: Thought content normal.        Judgment: Judgment normal.   Vitals reviewed.     Assessment/Plan: Encounter for annual routine gynecological examination  Encounter for surveillance of contraceptive pills - Plan: levonorgestrel-ethinyl estradiol (ALTAVERA) 0.15-30 MG-MCG tablet; OCP RF eRxd.   Encounter for screening mammogram for malignant neoplasm of breast - Plan: MM 3D SCREENING MAMMOGRAM BILATERAL BREAST; pt current on mammo  Herpes simplex vulvovaginitis - Plan: valACYclovir (VALTREX) 500 MG tablet; Rx RF   No orders of the defined types were placed in this encounter.            GYN counsel breast self exam, mammography screening, adequate intake of calcium and vitamin D, diet  and exercise     F/U  No follow-ups on file.  Janet Jones B. Zulema Pulaski, PA-C 01/30/2024 4:48 PM

## 2024-01-31 ENCOUNTER — Other Ambulatory Visit (HOSPITAL_COMMUNITY)
Admission: RE | Admit: 2024-01-31 | Discharge: 2024-01-31 | Disposition: A | Discharge status: Home / Self Care | Source: Ambulatory Visit | Attending: Obstetrics and Gynecology | Admitting: Obstetrics and Gynecology

## 2024-01-31 ENCOUNTER — Ambulatory Visit (INDEPENDENT_AMBULATORY_CARE_PROVIDER_SITE_OTHER): Admitting: Obstetrics and Gynecology

## 2024-01-31 ENCOUNTER — Other Ambulatory Visit: Payer: Self-pay

## 2024-01-31 ENCOUNTER — Encounter: Payer: Self-pay | Admitting: Obstetrics and Gynecology

## 2024-01-31 ENCOUNTER — Telehealth: Payer: Self-pay

## 2024-01-31 VITALS — BP 124/72 | Ht 64.0 in | Wt 176.0 lb

## 2024-01-31 DIAGNOSIS — Z1151 Encounter for screening for human papillomavirus (HPV): Secondary | ICD-10-CM | POA: Diagnosis present

## 2024-01-31 DIAGNOSIS — Z1211 Encounter for screening for malignant neoplasm of colon: Secondary | ICD-10-CM

## 2024-01-31 DIAGNOSIS — Z124 Encounter for screening for malignant neoplasm of cervix: Secondary | ICD-10-CM | POA: Insufficient documentation

## 2024-01-31 DIAGNOSIS — Z01419 Encounter for gynecological examination (general) (routine) without abnormal findings: Secondary | ICD-10-CM

## 2024-01-31 DIAGNOSIS — A6004 Herpesviral vulvovaginitis: Secondary | ICD-10-CM

## 2024-01-31 DIAGNOSIS — Z1231 Encounter for screening mammogram for malignant neoplasm of breast: Secondary | ICD-10-CM

## 2024-01-31 DIAGNOSIS — Z3041 Encounter for surveillance of contraceptive pills: Secondary | ICD-10-CM

## 2024-01-31 MED ORDER — VALACYCLOVIR HCL 500 MG PO TABS: 500.0000 mg | ORAL_TABLET | Freq: Every day | ORAL | 3 refills | Status: AC

## 2024-01-31 MED ORDER — LEVONORGESTREL-ETHINYL ESTRAD 0.15-30 MG-MCG PO TABS: 1.0000 | ORAL_TABLET | Freq: Every day | ORAL | 3 refills | Status: AC

## 2024-01-31 MED ORDER — FLUCONAZOLE 150 MG PO TABS: 150.0000 mg | ORAL_TABLET | Freq: Once | ORAL | 0 refills | Status: AC

## 2024-01-31 MED ORDER — NA SULFATE-K SULFATE-MG SULF 17.5-3.13-1.6 GM/177ML PO SOLN: 1.0000 | Freq: Once | ORAL | 0 refills | Status: AC

## 2024-01-31 NOTE — Patient Instructions (Signed)
 I value your feedback and you entrusting [place] with your care. If you get a [facility] patient survey, I would appreciate you taking the time to let us know about your experience today. Thank you!  [facility] ([place])--[phone]

## 2024-01-31 NOTE — Telephone Encounter (Signed)
 Gastroenterology Pre-Procedure Review  Request Date: 03/16/24 Requesting Physician: Dr. Allegra Lai  PATIENT REVIEW QUESTIONS: The patient responded to the following health history questions as indicated:    1. Are you having any GI issues? no 2. Do you have a personal history of Polyps? no 3. Do you have a family history of Colon Cancer or Polyps? no 4. Diabetes Mellitus? no 5. Joint replacements in the past 12 months?no 6. Major health problems in the past 3 months?no 7. Any artificial heart valves, MVP, or defibrillator?no    MEDICATIONS & ALLERGIES:    Patient reports the following regarding taking any anticoagulation/antiplatelet therapy:   Plavix, Coumadin, Eliquis, Xarelto, Lovenox, Pradaxa, Brilinta, or Effient? no Aspirin? no  Patient confirms/reports the following medications:  Current Outpatient Medications  Medication Sig Dispense Refill   amoxicillin-clavulanate (AUGMENTIN) 875-125 MG tablet Take by mouth.     fluconazole (DIFLUCAN) 150 MG tablet Take 1 tablet (150 mg total) by mouth once for 1 dose. May repeat in 3 days if still having symptoms 2 tablet 0   levonorgestrel-ethinyl estradiol (ALTAVERA) 0.15-30 MG-MCG tablet Take 1 tablet by mouth daily. 84 tablet 3   valACYclovir (VALTREX) 500 MG tablet Take 1 tablet (500 mg total) by mouth daily. 90 tablet 3   No current facility-administered medications for this visit.    Patient confirms/reports the following allergies:  No Known Allergies  No orders of the defined types were placed in this encounter.   AUTHORIZATION INFORMATION Primary Insurance: 1D#: Group #:  Secondary Insurance: 1D#: Group #:  SCHEDULE INFORMATION: Date: 03/16/24  Time: Location: [facility]

## 2024-02-01 LAB — CYTOLOGY - PAP
Comment: NEGATIVE
Diagnosis: NEGATIVE
High risk HPV: NEGATIVE

## 2024-03-08 ENCOUNTER — Encounter: Payer: Self-pay | Admitting: Gastroenterology

## 2024-03-08 NOTE — Anesthesia Preprocedure Evaluation (Addendum)
 Anesthesia Evaluation  Patient identified by MRN, date of birth, ID band Patient awake    Reviewed: Allergy & Precautions, NPO status , Patient's Chart, lab work & pertinent test results  Airway Mallampati: II  TM Distance: >3 FB Neck ROM: full    Dental  (+) Teeth Intact   Pulmonary neg pulmonary ROS   Pulmonary exam normal breath sounds clear to auscultation       Cardiovascular Exercise Tolerance: Good negative cardio ROS Normal cardiovascular exam Rhythm:Regular Rate:Normal     Neuro/Psych negative neurological ROS  negative psych ROS   GI/Hepatic negative GI ROS, Neg liver ROS,,,  Endo/Other  negative endocrine ROS    Renal/GU negative Renal ROS  negative genitourinary   Musculoskeletal negative musculoskeletal ROS (+)    Abdominal   Peds negative pediatric ROS (+)  Hematology negative hematology ROS (+)   Anesthesia Other Findings Past Medical History: No date: Genital herpes  Past Surgical History: No date: NO PAST SURGERIES  BMI    Body Mass Index: 29.70 kg/m      Reproductive/Obstetrics negative OB ROS                             Anesthesia Physical Anesthesia Plan  ASA: 1  Anesthesia Plan: General   Post-op Pain Management:    Induction: Intravenous  PONV Risk Score and Plan: Propofol infusion and TIVA  Airway Management Planned: Natural Airway and Nasal Cannula  Additional Equipment:   Intra-op Plan:   Post-operative Plan:   Informed Consent: I have reviewed the patients History and Physical, chart, labs and discussed the procedure including the risks, benefits and alternatives for the proposed anesthesia with the patient or authorized representative who has indicated his/her understanding and acceptance.     Dental Advisory Given  Plan Discussed with: CRNA  Anesthesia Plan Comments:        Anesthesia Quick Evaluation

## 2024-03-16 ENCOUNTER — Ambulatory Visit
Admission: RE | Admit: 2024-03-16 | Discharge: 2024-03-16 | Disposition: A | Discharge status: Home / Self Care | Attending: Gastroenterology | Admitting: Gastroenterology

## 2024-03-16 ENCOUNTER — Encounter
Admission: RE | Disposition: A | Discharge status: Home / Self Care | Payer: Self-pay | Source: Home / Self Care | Attending: Gastroenterology

## 2024-03-16 ENCOUNTER — Encounter: Payer: Self-pay | Admitting: Gastroenterology

## 2024-03-16 ENCOUNTER — Ambulatory Visit: Payer: Self-pay | Admitting: Anesthesiology

## 2024-03-16 ENCOUNTER — Other Ambulatory Visit: Payer: Self-pay

## 2024-03-16 DIAGNOSIS — Z1211 Encounter for screening for malignant neoplasm of colon: Secondary | ICD-10-CM | POA: Diagnosis present

## 2024-03-16 LAB — POCT PREGNANCY, URINE: Preg Test, Ur: NEGATIVE

## 2024-03-16 SURGERY — COLONOSCOPY
Anesthesia: General

## 2024-03-16 MED ORDER — GLYCOPYRROLATE 0.2 MG/ML IJ SOLN
INTRAMUSCULAR | Status: DC | PRN
  Administered 2024-03-16: .1 mg via INTRAVENOUS

## 2024-03-16 MED ORDER — PROPOFOL 10 MG/ML IV BOLUS
INTRAVENOUS | Status: DC | PRN
  Administered 2024-03-16: 100 mg via INTRAVENOUS

## 2024-03-16 MED ORDER — DEXMEDETOMIDINE HCL IN NACL 80 MCG/20ML IV SOLN
INTRAVENOUS | Status: DC | PRN
  Administered 2024-03-16: 8 ug via INTRAVENOUS

## 2024-03-16 MED ORDER — LIDOCAINE HCL (CARDIAC) PF 100 MG/5ML IV SOSY
PREFILLED_SYRINGE | INTRAVENOUS | Status: DC | PRN
  Administered 2024-03-16: 50 mg via INTRAVENOUS

## 2024-03-16 MED ORDER — PROPOFOL 500 MG/50ML IV EMUL
INTRAVENOUS | Status: DC | PRN
  Administered 2024-03-16: 150 ug/kg/min via INTRAVENOUS

## 2024-03-16 MED ORDER — SODIUM CHLORIDE 0.9 % IV SOLN: INTRAVENOUS | Status: DC

## 2024-03-16 SURGICAL SUPPLY — 16 items
CLIP HMST 235XBRD CATH ROT (MISCELLANEOUS) IMPLANT
ELECTRODE REM PT RTRN 9FT ADLT (ELECTROSURGICAL) IMPLANT
FORCEPS BIOP RAD 4 LRG CAP 4 (CUTTING FORCEPS) IMPLANT
FORCEPS ESCP3.2XJMB 240X2.8X (MISCELLANEOUS) IMPLANT
GAUZE SPONGE 4X4 12PLY STRL (GAUZE/BANDAGES/DRESSINGS) IMPLANT
GOWN CVR UNV OPN BCK APRN NK (MISCELLANEOUS) ×4 IMPLANT
INJECTOR VARIJECT VIN23 (MISCELLANEOUS) IMPLANT
KIT DEFENDO VALVE AND CONN (KITS) IMPLANT
KIT PRC NS LF DISP ENDO (KITS) ×2 IMPLANT
MANIFOLD NEPTUNE II (INSTRUMENTS) ×2 IMPLANT
MARKER SPOT ENDO TATTOO 5ML (MISCELLANEOUS) IMPLANT
PROBE APC STR FIRE (PROBE) IMPLANT
RETRIEVER NET ROTH 2.5X230 LF (MISCELLANEOUS) IMPLANT
SNARE COLD EXACTO (MISCELLANEOUS) IMPLANT
TRAP ETRAP POLY (MISCELLANEOUS) IMPLANT
WATER STERILE IRR 250ML POUR (IV SOLUTION) ×2 IMPLANT

## 2024-03-16 NOTE — Transfer of Care (Signed)
 Immediate Anesthesia Transfer of Care Note  Patient: Janet Jones  Procedure(s) Performed: COLONOSCOPY  Patient Location: PACU  Anesthesia Type:General  Level of Consciousness: sedated  Airway & Oxygen Therapy: Patient Spontanous Breathing and Patient connected to nasal cannula oxygen  Post-op Assessment: Report given to RN, Post -op Vital signs reviewed and stable, and Patient moving all extremities X 4  Post vital signs: Reviewed and stable  Last Vitals:  Vitals Value Taken Time  BP    Temp    Pulse 83 03/16/24 0927  Resp 19 03/16/24 0927  SpO2 100 % 03/16/24 0927  Vitals shown include unfiled device data.  Last Pain:  Vitals:   03/16/24 0820  TempSrc: Temporal  PainSc: 0-No pain         Complications: No notable events documented.

## 2024-03-16 NOTE — H&P (Signed)
 Karma Oz, MD [address]  [address]  [place], [place] [address]  Main: [phone]  Fax: [phone] Pager: [phone]  Primary Care Physician:  Patient, No Pcp Per Primary Gastroenterologist:  Dr. Karma Oz  Pre-Procedure History & Physical: HPI:  Janet Jones is a 43 y.o. female is here for an colonoscopy.   Past Medical History:  Diagnosis Date   Genital herpes     Past Surgical History:  Procedure Laterality Date   NO PAST SURGERIES      Prior to Admission medications   Medication Sig Start Date End Date Taking? Authorizing Provider  levonorgestrel -ethinyl estradiol  (ALTAVERA) 0.15-30 MG-MCG tablet Take 1 tablet by mouth daily. 01/31/24  Yes Copland, Alicia B, PA-C  valACYclovir  (VALTREX ) 500 MG tablet Take 1 tablet (500 mg total) by mouth daily. 01/31/24  Yes Copland, Alicia B, PA-C    Allergies as of 01/31/2024   (No Known Allergies)    Family History  Problem Relation Age of Onset   Hypertension Mother    Heart disease Father    Heart attack Maternal Grandfather    Lung cancer Paternal Grandmother    Prostate cancer Paternal Grandmother    Breast cancer Paternal Aunt 47    Social History   Socioeconomic History   Marital status: Single    Spouse name: Not on file   Number of children: Not on file   Years of education: Not on file   Highest education level: Not on file  Occupational History   Not on file  Tobacco Use   Smoking status: Never   Smokeless tobacco: Never  Vaping Use   Vaping status: Never Used  Substance and Sexual Activity   Alcohol use: Not Currently   Drug use: Never   Sexual activity: Not Currently    Birth control/protection: Pill  Other Topics Concern   Not on file  Social History Narrative   Not on file   Social Drivers of Health   Financial Resource Strain: Low Risk  (10/10/2023)   [facility]   Overall Financial Resource Strain (CARDIA)    Difficulty of Paying  Living Expenses: Not very hard  Food Insecurity: No Food Insecurity (10/10/2023)   Received from [facility]   Hunger Vital Sign    Worried About Running Out of Food in the Last Year: Never true    Ran Out of Food in the Last Year: Never true  Transportation Needs: No Transportation Needs (10/10/2023)   Received from [facility] - Transportation    In the past 12 months, has lack of transportation kept you from medical appointments or from getting medications?: No    Lack of Transportation (Non-Medical): No  Physical Activity: Not on file  Stress: Not on file  Social Connections: Not on file  Intimate Partner Violence: Not on file    Review of Systems: See HPI, otherwise negative ROS  Physical Exam: Pulse 81   Temp (!) 97.5 F (36.4 C) (Temporal)   Resp 18   Ht 5\' 4"  (1.626 m)   Wt 78.7 kg   SpO2 100%   BMI 29.76 kg/m  General:   Alert,  pleasant and cooperative in NAD Head:  Normocephalic and atraumatic. Neck:  Supple; no masses or thyromegaly. Lungs:  Clear throughout to auscultation.    Heart:  Regular rate and rhythm. Abdomen:  Soft, nontender and nondistended. Normal bowel sounds, without guarding, and without rebound.  Neurologic:  Alert and  oriented x4;  grossly normal neurologically.  Impression/Plan: Janet Jones is here for an colonoscopy to be performed for colon cancer screening  Risks, benefits, limitations, and alternatives regarding  colonoscopy have been reviewed with the patient.  Questions have been answered.  All parties agreeable.   Ellis Guys, MD  03/16/2024, 8:52 AM

## 2024-03-16 NOTE — Op Note (Signed)
 [facility], [place] [facility] Patient Name: Janet Jones Procedure Date: 03/16/2024 8:58 AM MRN: [id number] Account #: [id number] Date of Birth: 10-Dec-1980 Admit Type: Outpatient Age: 43 Room: [facility] ENDO ROOM 1 Gender: Female Note Status: Finalized Instrument Name: Charlyn Cooley [id number] Procedure:             Colonoscopy Indications:           Screening for colorectal malignant neoplasm, This is                         the patient's first colonoscopy Providers:             Selena Daily MD, MD Medicines:             General Anesthesia Complications:         No immediate complications. Estimated blood loss: None. Procedure:             Pre-Anesthesia Assessment:                        - Prior to the procedure, a History and Physical was                         performed, and patient medications and allergies were                         reviewed. The patient is competent. The risks and                         benefits of the procedure and the sedation options and                         risks were discussed with the patient. All questions                         were answered and informed consent was obtained.                         Patient identification and proposed procedure were                         verified by the physician, the nurse, the                         anesthesiologist, the anesthetist and the technician                         in the pre-procedure area in the procedure room in the                         endoscopy suite. Mental Status Examination: alert and                         oriented. Airway Examination: normal oropharyngeal                         airway and neck mobility. Respiratory Examination:                         clear to auscultation.  CV Examination: normal.                         Prophylactic Antibiotics: The patient does not require                         prophylactic antibiotics. Prior Anticoagulants: The                          patient has taken no anticoagulant or antiplatelet                         agents. ASA Grade Assessment: I - A normal, healthy                         patient. After reviewing the risks and benefits, the                         patient was deemed in satisfactory condition to                         undergo the procedure. The anesthesia plan was to use                         general anesthesia. Immediately prior to                         administration of medications, the patient was                         re-assessed for adequacy to receive sedatives. The                         heart rate, respiratory rate, oxygen saturations,                         blood pressure, adequacy of pulmonary ventilation, and                         response to care were monitored throughout the                         procedure. The physical status of the patient was                         re-assessed after the procedure.                        After obtaining informed consent, the colonoscope was                         passed under direct vision. Throughout the procedure,                         the patient's blood pressure, pulse, and oxygen                         saturations were monitored continuously. The  Colonoscope was introduced through the anus and                         advanced to the the cecum, identified by appendiceal                         orifice and ileocecal valve. The colonoscopy was                         performed without difficulty. The patient tolerated                         the procedure well. The quality of the bowel                         preparation was evaluated using the BBPS [facility] Bowel                         Preparation Scale) with scores of: Right Colon = 3,                         Transverse Colon = 3 and Left Colon = 3 (entire mucosa                         seen well with no residual staining, small fragments                          of stool or opaque liquid). The total BBPS score                         equals 9. The ileocecal valve, appendiceal orifice,                         and rectum were photographed. Findings:      The perianal and digital rectal examinations were normal. Pertinent       negatives include normal sphincter tone and no palpable rectal lesions.      The entire examined colon appeared normal.      Normal rectum on forward view, unable to do retroflexion in rectum safely Impression:            - The entire examined colon is normal.                        - No specimens collected. Recommendation:        - Discharge patient to home (with escort).                        - Resume previous diet today.                        - Continue present medications.                        - Repeat colonoscopy in 10 years for screening                         purposes. Procedure Code(s):     --- Professional ---  Q0347, Colorectal cancer screening; colonoscopy on                         individual not meeting criteria for high risk Diagnosis Code(s):     --- Professional ---                        Z12.11, Encounter for screening for malignant neoplasm                         of colon CPT copyright 2022 [employer]. All rights reserved. The codes documented in this report are preliminary and upon coder review may  be revised to meet current compliance requirements. Dr. Evia Hof Selena Daily MD, MD 03/16/2024 9:24:14 AM This report has been signed electronically. Number of Addenda: 0 Note Initiated On: 03/16/2024 8:58 AM Scope Withdrawal Time: 0 hours 6 minutes 13 seconds  Total Procedure Duration: 0 hours 9 minutes 22 seconds  Estimated Blood Loss:  Estimated blood loss: none.      [facility]

## 2024-03-16 NOTE — Anesthesia Postprocedure Evaluation (Signed)
 Anesthesia Post Note  Patient: Janet Jones  Procedure(s) Performed: COLONOSCOPY  Patient location during evaluation: PACU Anesthesia Type: General Level of consciousness: awake and awake and alert Pain management: satisfactory to patient Vital Signs Assessment: post-procedure vital signs reviewed and stable Respiratory status: spontaneous breathing Cardiovascular status: blood pressure returned to baseline Anesthetic complications: no   No notable events documented.   Last Vitals:  Vitals:   03/16/24 0926 03/16/24 0937  BP: 113/72 122/82  Pulse: 83 83  Resp: 19 12  Temp: (!) 36.3 C   SpO2: 100% 100%    Last Pain:  Vitals:   03/16/24 0937  TempSrc:   PainSc: 0-No pain                 VAN STAVEREN,Corleen Otwell

## 2024-03-19 ENCOUNTER — Encounter: Payer: Self-pay | Admitting: Gastroenterology

## 2024-09-06 ENCOUNTER — Ambulatory Visit
Admission: RE | Admit: 2024-09-06 | Discharge: 2024-09-06 | Disposition: A | Discharge status: Home / Self Care | Source: Ambulatory Visit | Attending: Obstetrics and Gynecology | Admitting: Obstetrics and Gynecology

## 2024-09-06 DIAGNOSIS — Z1231 Encounter for screening mammogram for malignant neoplasm of breast: Secondary | ICD-10-CM | POA: Insufficient documentation

## 2024-09-10 ENCOUNTER — Ambulatory Visit: Payer: Self-pay | Admitting: Obstetrics and Gynecology
